# Patient Record
Sex: Male | Born: 1970 | Hispanic: No | Marital: Married | Smoking: Never smoker
Health system: Southern US, Community
[De-identification: ages and names within clinical notes are randomized; demographics above are authoritative.]

## PROBLEM LIST (undated history)

## (undated) DIAGNOSIS — K529 Noninfective gastroenteritis and colitis, unspecified: Secondary | ICD-10-CM

## (undated) DIAGNOSIS — Z9289 Personal history of other medical treatment: Secondary | ICD-10-CM

## (undated) DIAGNOSIS — R0683 Snoring: Secondary | ICD-10-CM

## (undated) DIAGNOSIS — J329 Chronic sinusitis, unspecified: Secondary | ICD-10-CM

## (undated) DIAGNOSIS — E782 Mixed hyperlipidemia: Secondary | ICD-10-CM

## (undated) DIAGNOSIS — R000 Tachycardia, unspecified: Secondary | ICD-10-CM

## (undated) DIAGNOSIS — R51 Headache: Secondary | ICD-10-CM

## (undated) DIAGNOSIS — R7989 Other specified abnormal findings of blood chemistry: Secondary | ICD-10-CM

## (undated) DIAGNOSIS — I10 Essential (primary) hypertension: Secondary | ICD-10-CM

## (undated) DIAGNOSIS — E119 Type 2 diabetes mellitus without complications: Secondary | ICD-10-CM

## (undated) DIAGNOSIS — Z0000 Encounter for general adult medical examination without abnormal findings: Secondary | ICD-10-CM

## (undated) HISTORY — DX: Essential (primary) hypertension: I10

## (undated) HISTORY — DX: Encounter for general adult medical examination without abnormal findings: Z00.00

## (undated) HISTORY — PX: OTHER SURGICAL HISTORY: SHX169

## (undated) HISTORY — DX: Headache: R51

## (undated) HISTORY — DX: Other specified abnormal findings of blood chemistry: R79.89

## (undated) HISTORY — DX: Personal history of other medical treatment: Z92.89

## (undated) HISTORY — DX: Mixed hyperlipidemia: E78.2

## (undated) HISTORY — DX: Type 2 diabetes mellitus without complications: E11.9

## (undated) HISTORY — DX: Noninfective gastroenteritis and colitis, unspecified: K52.9

## (undated) HISTORY — DX: Chronic sinusitis, unspecified: J32.9

## (undated) HISTORY — DX: Tachycardia, unspecified: R00.0

## (undated) HISTORY — DX: Snoring: R06.83

---

## 1999-10-11 HISTORY — PX: OTHER SURGICAL HISTORY: SHX169

## 2007-10-11 DIAGNOSIS — Z9289 Personal history of other medical treatment: Secondary | ICD-10-CM

## 2007-10-11 HISTORY — DX: Personal history of other medical treatment: Z92.89

## 2008-08-06 ENCOUNTER — Encounter: Payer: Self-pay | Admitting: Internal Medicine

## 2008-08-11 ENCOUNTER — Encounter: Payer: Self-pay | Admitting: Internal Medicine

## 2009-07-11 ENCOUNTER — Emergency Department (HOSPITAL_BASED_OUTPATIENT_CLINIC_OR_DEPARTMENT_OTHER): Admission: EM | Admit: 2009-07-11 | Discharge: 2009-07-12 | Payer: Self-pay | Admitting: Emergency Medicine

## 2009-07-11 ENCOUNTER — Ambulatory Visit: Payer: Self-pay | Admitting: Diagnostic Radiology

## 2010-08-12 ENCOUNTER — Ambulatory Visit: Payer: Self-pay | Admitting: Internal Medicine

## 2010-08-12 DIAGNOSIS — I10 Essential (primary) hypertension: Secondary | ICD-10-CM | POA: Insufficient documentation

## 2010-08-16 ENCOUNTER — Encounter: Payer: Self-pay | Admitting: Internal Medicine

## 2010-08-16 LAB — CONVERTED CEMR LAB: Creatinine Clearance: 183 mL/min — ABNORMAL HIGH (ref 75–125)

## 2010-08-24 LAB — CONVERTED CEMR LAB
ALT: 17 units/L (ref 0–53)
AST: 18 units/L (ref 0–37)
Albumin: 4.5 g/dL (ref 3.5–5.2)
Alkaline Phosphatase: 41 units/L (ref 39–117)
BUN: 16 mg/dL (ref 6–23)
Bilirubin, Direct: 0.1 mg/dL (ref 0.0–0.3)
C-Peptide: 0.95 ng/mL (ref 0.80–3.90)
CO2: 27 meq/L (ref 19–32)
Creatinine, Ser: 0.95 mg/dL (ref 0.40–1.50)
Creatinine, Urine: 266.5 mg/dL
Ferritin: 209 ng/mL (ref 22–322)
HDL: 49 mg/dL (ref >39)
Indirect Bilirubin: 0.5 mg/dL (ref 0.0–0.9)
LDL Cholesterol: 158 mg/dL — ABNORMAL HIGH (ref 0–99)
Potassium: 4.6 meq/L (ref 3.5–5.3)
Sodium: 138 meq/L (ref 135–145)
Total Bilirubin: 0.6 mg/dL (ref 0.3–1.2)
Total Protein: 6.9 g/dL (ref 6.0–8.3)
Triglycerides: 79 mg/dL (ref <150)

## 2010-08-25 ENCOUNTER — Encounter: Payer: Self-pay | Admitting: Internal Medicine

## 2010-09-10 ENCOUNTER — Encounter: Payer: Self-pay | Admitting: Internal Medicine

## 2010-09-13 ENCOUNTER — Ambulatory Visit: Payer: Self-pay

## 2010-09-13 ENCOUNTER — Encounter: Payer: Self-pay | Admitting: Internal Medicine

## 2010-09-14 ENCOUNTER — Encounter: Payer: Self-pay | Admitting: Internal Medicine

## 2010-09-27 ENCOUNTER — Encounter: Payer: Self-pay | Admitting: Internal Medicine

## 2010-09-27 DIAGNOSIS — E782 Mixed hyperlipidemia: Secondary | ICD-10-CM | POA: Insufficient documentation

## 2010-09-27 DIAGNOSIS — M5412 Radiculopathy, cervical region: Secondary | ICD-10-CM | POA: Insufficient documentation

## 2010-09-27 DIAGNOSIS — E119 Type 2 diabetes mellitus without complications: Secondary | ICD-10-CM | POA: Insufficient documentation

## 2010-09-27 DIAGNOSIS — R0789 Other chest pain: Secondary | ICD-10-CM | POA: Insufficient documentation

## 2010-09-27 HISTORY — DX: Mixed hyperlipidemia: E78.2

## 2010-09-29 ENCOUNTER — Ambulatory Visit: Payer: Self-pay | Admitting: Internal Medicine

## 2010-11-09 NOTE — Miscellaneous (Signed)
Summary: Eye Exam  Clinical Lists Changes  Observations: Added new observation of DMEYEEXAMNXT: 09/2011 (09/14/2010 12:22) Added new observation of DMEYEEXMRES: normal (09/13/2010 12:24) Added new observation of EYE EXAM BY: [facility]  (09/13/2010 12:24) Added new observation of DIAB EYE EX: normal (09/13/2010 12:24)         Diabetes Management Exam:    Eye Exam:       Eye Exam done elsewhere          Date: 09/13/2010          Results: normal          Done by: [facility]

## 2010-11-09 NOTE — Letter (Signed)
   [place] at [facility] [address] [place], [place]  [address]  [place] Phone: [phone]      August 25, 2010   Darren Salazar [address] [place], [place] [address]  RE:  LAB RESULTS  Dear  Mr. HUGULEY,  The following is an interpretation of your most recent lab tests.  Please take note of any instructions provided or changes to medications that have resulted from your lab work.  ELECTROLYTES:  Good - no changes needed  KIDNEY FUNCTION TESTS:  Good - no changes needed  LIVER FUNCTION TESTS:  Good - no changes needed  LIPID PANEL:  Abnormal - schedule a follow-up appointment Triglyceride: 79   Cholesterol: 223   LDL: 158   HDL: 49   Chol/HDL%:  4.6 Ratio  DIABETIC STUDIES:  Good - no changes needed Blood Glucose: 108   HgbA1C: 6.0   Microalbumin/Creatinine Ratio: 2.3     Please follow low saturated fat diet (see enclosed handout).       Sincerely Yours,    Dr. Thomos Lemons  Appended Document:  mailed

## 2010-11-09 NOTE — Miscellaneous (Signed)
Summary: Orders Update  Clinical Lists Changes  Orders: Added new Test order of Renal Artery Duplex (Renal Artery Duplex) - Signed 

## 2010-11-09 NOTE — Assessment & Plan Note (Signed)
Summary: new to be est bcbs/mhf   Vital Signs:  Patient profile:   40 year old male Height:      67.5 inches Weight:      181.50 pounds BMI:     28.11 O2 Sat:      99 % on Room air Temp:     98.2 degrees F oral Pulse rate:   83 / minute Pulse rhythm:   irregular BP sitting:   126 / 70  (left arm) Cuff size:   large  Vitals Entered By: Glendell Docker CMA (August 12, 2010 9:32 AM)  O2 Flow:  Room air CC: New patient  Is Patient Diabetic? Yes Did you bring your meter with you today? No Pain Assessment Patient in pain? no      Comments establish care, left knee ? fluid build up, joint is stiff, notices after activity   Primary Care Provider:  Dondra Spry DO  CC:  New patient .  History of Present Illness: 40 y/o  male to establish diagnosed DM II this summer -  doesn't know A1c 90-120 (fasting) some wt flucations no severe obesity  father has diabetes type II  htn - since 2006 hx of severe elevations (hospitalized) no work up for secondary htn  Preventive Screening-Counseling & Management  Alcohol-Tobacco     Alcohol drinks/day: 0     Smoking Status: never  Caffeine-Diet-Exercise     Caffeine use/day: 1 every other day     Does Patient Exercise: yes     Times/week: 4      Drug Use:  no.    Allergies (verified): No Known Drug Allergies  Past History:  Past Medical History: Diabetes mellitus, type II Hypertension  Past Surgical History: Cyst removal behind left ear-2001  Family History: Family History Diabetes 1st degree relative - father Family History Hypertension - mom and dad No CAD No CVA sister - Conservator, museum/gallery in [place] ( some complication from pregnancy ) ? 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    Physical Exam  General:  alert, well-developed, and well-nourished.   Head:  normocephalic and atraumatic.   Eyes:  pupils equal, pupils round, and pupils reactive to light.   Ears:  R ear normal and L ear normal.   Mouth:  pharynx pink and moist.   Neck:  No deformities, masses, or tenderness noted.no carotid bruits.   Lungs:  normal respiratory effort, normal breath sounds, no crackles, and no wheezes.   Heart:  normal rate, regular rhythm, no murmur, and no gallop.   Abdomen:  soft, non-tender, normal bowel sounds, no masses, no hepatomegaly, and no splenomegaly.  no abd bruit Extremities:  No lower extremity edema   Diabetes Management Exam:    Foot Exam (with socks and/or shoes not present):       Inspection:          Left foot: normal          Right foot: normal   Impression & Recommendations:  Problem # 1:  HYPERTENSION (ICD-401.9) Hx of severe Htn.  rule out secondary htn His  updated medication list for this problem includes:    Lisinopril-hydrochlorothiazide 20-12.5 Mg Tabs (Lisinopril-hydrochlorothiazide) .Marland Kitchen... Take 1 tablet by mouth once a day  Orders: T-Urine 24hr. Creatinine Clearance [id number]) T- * Misc. Laboratory test [phone]) T-Basic Metabolic Panel [phone]) Doppler Referral (Doppler)  BP today: 126/70  Problem # 2:  DIABETES MELLITUS, TYPE II (ICD-250.00) newly diagnosed diabetic (summer 2011)  His updated medication list for this problem includes:    Lisinopril-hydrochlorothiazide 20-12.5 Mg Tabs (Lisinopril-hydrochlorothiazide) .Marland Kitchen... Take 1 tablet by mouth once a day    Glucophage Xr 500 Mg Xr24h-tab (Metformin hcl) .Marland Kitchen... Take 1 tablet by mouth once a day with meal  Orders: T-Hepatic Function [id number]) T-Lipid Profile [phone]) T- Hemoglobin  A1C (27[address]-66440) T-Urine Microalbumin w/creat. ratio [id number]) T-Iron [id number]) T-Iron Binding Capacity (TIBC) ([address]) T-Ferritin [phone]) T- * Misc. Laboratory test [phone]) Ophthalmology Referral (Ophthalmology)  Complete Medication List: 1)  Lisinopril-hydrochlorothiazide 20-12.5 Mg Tabs (Lisinopril-hydrochlorothiazide) .... Take 1 tablet by mouth once a day 2)  Glucophage Xr 500 Mg Xr24h-tab (Metformin hcl) .... Take 1 tablet by mouth once a day with meal 3)  Multivitamins Tabs (Multiple vitamin) .... Take 1 tablet by mouth once a day  Other Orders: Flu Vaccine 107yrs + ([address]270) Admin 1st Vaccine ([address]76)  Patient Instructions: 1)  Please schedule a follow-up appointment in 2 months. Prescriptions: GLUCOPHAGE XR 500 MG XR24H-TAB (METFORMIN HCL) Take 1 tablet by mouth once a day with meal  #30 x 2   Entered and Authorized by:   D. Thomos Lemons DO   Signed by:   D. Thomos Lemons DO on 08/12/2010   Method used:   Print then Give to Patient   RxID:   [id number] LISINOPRIL-HYDROCHLOROTHIAZIDE 20-12.5 MG TABS (LISINOPRIL-HYDROCHLOROTHIAZIDE) Take 1 tablet by mouth once a day  #30 x 2   Entered and Authorized by:   D. Thomos Lemons DO   Signed by:   D. Thomos Lemons DO on 08/12/2010   Method used:   Print then Give to Patient   RxID:   [id number]    Orders Added: 1)  T-Urine 24hr. Creatinine Clearance [[id number]] 2)  T- * Misc. Laboratory test [99999] 3)  T-Basic Metabolic Panel [80048-22910] 4)  T-Hepatic Function [80076-22960] 5)  T-Lipid Profile [80061-22930] 6)  T- Hemoglobin A1C [83036-23375] 7)  T-Urine Microalbumin w/creat. ratio [[address]] 8)  T-Iron [93818-29937] 9)  T-Iron Binding Capacity (TIBC) [[address]] 10)  T-Ferritin [82728-23350] 11)  T- * Misc. Laboratory test [99999] 12)  Doppler Referral [Doppler] 13)  Flu Vaccine 22yrs + [90658] 14)  Admin 1st Vaccine [90471] 15)  Ophthalmology Referral [Ophthalmology] 16)  New Patient  Level III [99203]   Immunization History:  Tetanus/Td Immunization History:    Tetanus/Td:  historical (07/20/2004)  Immunizations Administered:  Influenza Vaccine # 1:    Vaccine Type: Fluvax 3+    Site: right deltoid    Mfr: GlaxoSmithKline    Dose: 0.5 ml    Route: IM    Given by: Glendell Docker CMA    Exp. Date: 04/09/2011    Lot #: [id number]    VIS given: 05/04/10 version given August 12, 2010.  Flu Vaccine Consent Questions:    Do you have a history of severe allergic reactions to this vaccine? no    Any prior history of allergic reactions to egg and/or gelatin? no    Do you have a sensitivity to the preservative Thimersol? no    Do you have a past history of Guillan-Barre Syndrome? no    Do you  currently have an acute febrile illness? no    Have you ever had a severe reaction to latex? no    Vaccine information given and explained to patient? yes   Immunization History:  Tetanus/Td Immunization History:    Tetanus/Td:  Historical (07/20/2004)  Immunizations Administered:  Influenza Vaccine # 1:    Vaccine Type: Fluvax 3+    Site: right deltoid    Mfr: GlaxoSmithKline    Dose: 0.5 ml    Route: IM    Given by: Glendell Docker CMA    Exp. Date: 04/09/2011    Lot #: [id number]    VIS given: 05/04/10 version given August 12, 2010.  Current Allergies (reviewed today): No known allergies

## 2010-11-11 NOTE — Letter (Signed)
Summary: [facility]   Imported By: Sherian Rein 09/20/2010 14:22:37  _____________________________________________________________________  External Attachment:    Type:   Image     Comment:   External Document

## 2010-11-11 NOTE — Assessment & Plan Note (Signed)
Summary: 2 MONTH FOLLOW UP/MHF   Vital Signs:  Patient profile:   40 year old male Height:      67.5 inches Weight:      186 pounds BMI:     28.81 O2 Sat:      100 % on Room air Temp:     98.3 degrees F oral Pulse rate:   69 / minute Resp:     18 per minute BP sitting:   130 / 78  (right arm) Cuff size:   large  Vitals Entered By: Glendell Docker CMA (September 27, 2010 8:36 AM)  O2 Flow:  Room air CC: 2 Month Follow up Is Patient Diabetic? Yes Pain Assessment Patient in pain? yes     Location: neck Intensity: 8 Type: tingling Onset of pain  Intermittent Comments c/o tenderness in right pinky, neck pain off and on for the past 2 weeks, low blood sugar 99 high 120 avg 100-105   Primary Care Provider:  Dondra Spry DO  CC:  2 Month Follow up.  History of Present Illness: 2 days ago - woke up with neck pain that radiates to chest  in the past pt has intermittent pain along left arm pain can also radiate across chest when he works out - his symptoms get better symptoms seem to worse with stress  left side of the neck symptomatic   Preventive Screening-Counseling & Management  Alcohol-Tobacco     Smoking Status: never  Allergies (verified): No Known Drug Allergies  Past History:  Past Medical History: Diabetes mellitus, type II Hypertension Hx of normal stress test - 2009  Family History: Family History Diabetes 1st degree relative - father Family History Hypertension - mom and dad No CAD No CVA sister - Conservator, museum/gallery in [place] ( some complication from pregnancy ) ? 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   Head:  normocephalic and atraumatic.   Eyes:  pupils equal, pupils round, and pupils reactive to light.   Chest Wall:  no chest wall  tenderness Lungs:  normal respiratory effort and normal breath sounds.   Heart:  normal rate, regular rhythm, no gallop, and no rub.   Extremities:  No lower extremity edema  Neurologic:  left forearm - hyperreflexia patellar reflex +3 bilaterally Psych:  normally interactive, good eye contact, not anxious appearing, and not depressed appearing.     Impression & Recommendations:  Problem # 1:  HYPERTENSION (ICD-401.9)  renal artery normal slight elevation in urinary metanephrines refer to endo for further eval  His updated medication list for this problem includes:    Lisinopril-hydrochlorothiazide 20-12.5 Mg Tabs (Lisinopril-hydrochlorothiazide) .Marland Kitchen... Take 1 tablet by mouth once a day  BP today: 130/78 Prior BP: 126/70 (08/12/2010)  Labs Reviewed: K+: 4.6 (08/24/2010) Creat: : 0.95 (08/24/2010)   Chol: 223 (08/24/2010)   HDL: 49 (08/24/2010)   LDL: 158 (08/24/2010)   TG: 79 (08/24/2010)  Orders: Endocrinology Referral (Endocrine)  Problem # 2:  HYPERLIPIDEMIA (ICD-272.4) Assessment: Deteriorated  His updated medication list for this problem includes:    Lipitor 20 Mg Tabs (Atorvastatin calcium) ..... 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 Take 1 tablet by mouth once a day with meal  Problem # 5:  CHEST PAIN, ATYPICAL (ICD-786.59)  symptoms not exertional  last stress test was 2009 with [facility]  cardiology unclear if symptoms radiating from previous neck injury  Orders: CXR- 2view (CXR) EKG w/ Interpretation (93000)  Complete Medication List: 1)  Lisinopril-hydrochlorothiazide 20-12.5 Mg Tabs (Lisinopril-hydrochlorothiazide) .... Take 1 tablet by mouth once a  day 2)  Glucophage Xr 500 Mg Xr24h-tab (Metformin hcl) .... Take 1 tablet by mouth once a day with meal 3)  Multivitamins Tabs (Multiple vitamin) .... Take 1 tablet by mouth once a day 4)  Lipitor 20 Mg Tabs (Atorvastatin calcium) .... One by mouth once daily 5)  Gabapentin 100 Mg Caps (Gabapentin) .... One by mouth at bedtime  Patient Instructions: 1)  Please schedule a follow-up appointment in 2 months. 2)  Hepatic Panel prior to visit, ICD-9: 272.4 3)  Lipid Panel prior to visit, ICD-9: 272.4 4)  Please return for lab work one (1) week before your next appointment.  Prescriptions: GLUCOPHAGE XR 500 MG XR24H-TAB (METFORMIN HCL) Take 1 tablet by mouth once a day with meal  #30 x 3   Entered and Authorized by:   D. Thomos Lemons DO   Signed by:   D. Thomos Lemons DO on 09/27/2010   Method used:   Electronically to        [facility] (retail)       [address]       [place], [place]  [address]       Ph: [phone]       Fax: [phone]   RxID:   [id number] LISINOPRIL-HYDROCHLOROTHIAZIDE 20-12.5 MG TABS (LISINOPRIL-HYDROCHLOROTHIAZIDE) Take 1 tablet by mouth once a day  #30 x 3   Entered and Authorized by:   D. Thomos Lemons DO   Signed by:   D. Thomos Lemons DO on 09/27/2010   Method used:   Electronically to        [facility] (retail)       [address]       [place], [place]  [address]       Ph: [phone]       Fax: [phone]   RxID:   [id number] GABAPENTIN 100 MG CAPS (GABAPENTIN) one by mouth at bedtime  #30 x 1   Entered and Authorized by:   D. Thomos Lemons DO   Signed by:   D. Thomos Lemons DO on 09/27/2010   Method used:   Electronically to        [facility] (retail)       [address]       [place], [place]  [address]       Ph: [phone]       Fax: [phone]   RxID:   [id number] LIPITOR 20 MG TABS (ATORVASTATIN CALCIUM) one by mouth  once daily  #30 x 3   Entered and Authorized by:   D. Thomos Lemons DO   Signed by:   D. Thomos Lemons DO on 09/27/2010   Method used:   Electronically to        [facility] (retail)  [address]       [place], [place]  [address]       Ph: [phone]       Fax: [phone]   RxID:   [id number]    Orders Added: 1)  CXR- 2view [CXR] 2)  EKG w/ Interpretation [93000] 3)  Endocrinology Referral [Endocrine] 4)  Est. Patient Level IV [401[address]]    Current Allergies (reviewed today): No known allergies

## 2010-11-22 ENCOUNTER — Encounter: Payer: Self-pay | Admitting: Internal Medicine

## 2010-11-23 ENCOUNTER — Encounter: Payer: Self-pay | Admitting: Internal Medicine

## 2010-12-01 ENCOUNTER — Ambulatory Visit (INDEPENDENT_AMBULATORY_CARE_PROVIDER_SITE_OTHER): Payer: Federal, State, Local not specified - PPO | Admitting: Internal Medicine

## 2010-12-01 ENCOUNTER — Encounter: Payer: Self-pay | Admitting: Internal Medicine

## 2010-12-01 DIAGNOSIS — R7309 Other abnormal glucose: Secondary | ICD-10-CM

## 2010-12-01 DIAGNOSIS — I10 Essential (primary) hypertension: Secondary | ICD-10-CM

## 2010-12-01 NOTE — Miscellaneous (Signed)
Summary: Orders Update  Clinical Lists Changes  Orders: Added new Test order of T-Hepatitis Profile Acute [phone]) - Signed Added new Test order of T-Lipid Profile [phone]) - Signed

## 2010-12-16 NOTE — Consult Note (Signed)
Summary: [facility]   Imported By: Maryln Gottron 12/08/2010 15:08:28  _____________________________________________________________________  External Attachment:    Type:   Image     Comment:   External Document

## 2010-12-21 NOTE — Assessment & Plan Note (Signed)
Summary: 2 month follow up/mhf   Vital Signs:  Patient profile:   40 year old male Height:      67.5 inches Weight:      187.25 pounds BMI:     29.00 O2 Sat:      98 % on Room air Temp:     98.6 degrees F oral Pulse rate:   89 / minute Resp:     18 per minute BP sitting:   110 / 70  (right arm) Cuff size:   large  Vitals Entered By: Glendell Docker CMA (December 01, 2010 8:22 AM)  O2 Flow:  Room air  Primary Care Provider:  D. Thomos Lemons DO   History of Present Illness:  40 year old male for followup  Since previous visit patient seen by endocrinologist   pheochromocytoma felt to be unlikely  however  workup for Cushing's syndrome and  hyperaldosteronism initiated   hypertension-stable  DM II - stays active.  tolerating metformin  Preventive Screening-Counseling & Management  Alcohol-Tobacco     Smoking Status: never  Allergies (verified): No Known Drug Allergies  Past History:  Past Medical History: Diabetes mellitus, type II Hypertension Hx of normal stress test - 2009   Past Surgical History: Cyst removal behind left ear-2001   Family History: Family History Diabetes 1st degree relative - father Family History Hypertension - mom and dad No CAD No CVA sister - Conservator, museum/gallery in [place] ( some complication from pregnancy ) ? 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   Neck:  No deformities, masses, or tenderness noted.no carotid bruits.   Lungs:  normal respiratory effort and normal breath sounds.   Heart:  normal rate, regular rhythm, no gallop, and no rub.     Impression & Recommendations:  Problem # 1:  HYPERTENSION (ICD-401.9) Assessment Improved  patient evaluated by endocrinologist   pheochromocytoma thought to be less likely    workup for hyperaldosteronism and  Cushing syndrome pending  His updated medication list for this problem includes:    Lisinopril-hydrochlorothiazide 20-12.5 Mg Tabs (Lisinopril-hydrochlorothiazide) .Marland Kitchen... Take 1 tablet by mouth once a day  BP today: 110/70 Prior BP: 130/78 (09/27/2010)  Labs Reviewed: K+: 4.6 (08/24/2010) Creat: : 0.95 (08/24/2010)   Chol: 223 (08/24/2010)   HDL: 49 (08/24/2010)   LDL: 158 (08/24/2010)   TG: 79 (08/24/2010)  Problem # 2:  DIABETES MELLITUS, TYPE II, BORDERLINE (ICD-790.29)  His updated medication list for this problem includes:    Glucophage Xr 500 Mg Xr24h-tab (Metformin hcl) .Marland Kitchen... Take 1 tablet by mouth two times a day  Labs Reviewed: Creat: 0.95 (08/24/2010)     Last Eye Exam: normal (09/13/2010)  Complete Medication List: 1)  Lisinopril-hydrochlorothiazide 20-12.5 Mg Tabs (Lisinopril-hydrochlorothiazide) .... Take 1 tablet by mouth once a day 2)  Glucophage Xr 500 Mg Xr24h-tab (Metformin hcl) .... Take 1 tablet by mouth two times a day 3)  Multivitamins Tabs (Multiple vitamin) .... Take 1 tablet by mouth once a day 4)  Lipitor 20 Mg Tabs (Atorvastatin calcium) .... One by mouth once daily  Patient Instructions: 1)  Please schedule a follow-up appointment in 6 months. 2)  BMP prior to visit, ICD-9:  401.9 3)  HbgA1C prior to visit, ICD-9: 790.29 4)  Hepatic Panel prior to visit, ICD-9:  272.4 5)  Lipid Panel prior to visit, ICD-9: 272.4 6)  Please return for lab work in May, 2012 Prescriptions: GLUCOPHAGE XR 500 MG XR24H-TAB (METFORMIN HCL) Take 1 tablet by mouth two times a day  #180 x 1   Entered and Authorized by:   D. Thomos Lemons DO   Signed by:   D. Thomos Lemons DO on 12/01/2010   Method used:   Electronically to        [facility] (retail)       [address]       [place], [place]  [address]       Ph: [phone]       Fax: [phone]   RxID:   [id number] LIPITOR 20 MG TABS  (ATORVASTATIN CALCIUM) one by mouth once daily  #90 x 1   Entered and Authorized by:   D. Thomos Lemons DO   Signed by:   D. Thomos Lemons DO on 12/01/2010   Method used:   Electronically to        [facility] (retail)       [address]       [place], [place]  [address]       Ph: [phone]       Fax: [phone]   RxID:   [id number] LISINOPRIL-HYDROCHLOROTHIAZIDE 20-12.5 MG TABS (LISINOPRIL-HYDROCHLOROTHIAZIDE) Take 1 tablet by mouth once a day  #90 x 1   Entered and Authorized by:   D. Thomos Lemons DO   Signed by:   D. Thomos Lemons DO on 12/01/2010   Method used:   Electronically to        [facility] (retail)       [address]       [place], [place]  [address]       Ph: [phone]       Fax: [phone]   RxID:   [id number]    Orders Added: 1)  Est. Patient Level III [[address]]    Current Allergies (reviewed today): No known allergies

## 2011-01-13 LAB — PROTIME-INR: INR: 0.9 (ref 0.00–1.49)

## 2011-01-13 LAB — CBC
Hemoglobin: 14.7 g/dL (ref 13.0–17.0)
MCHC: 34.6 g/dL (ref 30.0–36.0)
Platelets: 175 10*3/uL (ref 150–400)
RBC: 4.71 MIL/uL (ref 4.22–5.81)
WBC: 4.4 10*3/uL (ref 4.0–10.5)

## 2011-01-13 LAB — POCT CARDIAC MARKERS
CKMB, poc: <1 ng/mL — ABNORMAL LOW (ref 1.0–8.0)
Myoglobin, poc: 24.7 ng/mL (ref 12–200)
Myoglobin, poc: 32.1 ng/mL (ref 12–200)
Troponin i, poc: <0.05 ng/mL (ref 0.00–0.09)

## 2011-01-13 LAB — DIFFERENTIAL
Basophils Absolute: 0 10*3/uL (ref 0.0–0.1)
Eosinophils Relative: 3 % (ref 0–5)
Lymphs Abs: 2 10*3/uL (ref 0.7–4.0)
Monocytes Absolute: 0.5 10*3/uL (ref 0.1–1.0)

## 2011-01-13 LAB — BASIC METABOLIC PANEL
BUN: 22 mg/dL (ref 6–23)
Calcium: 9.6 mg/dL (ref 8.4–10.5)
Creatinine, Ser: 1 mg/dL (ref 0.4–1.5)
Glucose, Bld: 138 mg/dL — ABNORMAL HIGH (ref 70–99)

## 2011-02-21 ENCOUNTER — Other Ambulatory Visit: Payer: Self-pay | Admitting: *Deleted

## 2011-02-21 ENCOUNTER — Other Ambulatory Visit: Payer: Self-pay | Admitting: Internal Medicine

## 2011-02-21 DIAGNOSIS — I10 Essential (primary) hypertension: Secondary | ICD-10-CM

## 2011-02-21 DIAGNOSIS — E785 Hyperlipidemia, unspecified: Secondary | ICD-10-CM

## 2011-02-21 DIAGNOSIS — R7309 Other abnormal glucose: Secondary | ICD-10-CM

## 2011-02-21 LAB — BASIC METABOLIC PANEL
CO2: 25 mEq/L (ref 19–32)
Calcium: 9.9 mg/dL (ref 8.4–10.5)
Glucose, Bld: 113 mg/dL — ABNORMAL HIGH (ref 70–99)
Sodium: 134 mEq/L — ABNORMAL LOW (ref 135–145)

## 2011-02-21 LAB — HEPATIC FUNCTION PANEL
ALT: 16 U/L (ref 0–53)
AST: 17 U/L (ref 0–37)
Albumin: 4.7 g/dL (ref 3.5–5.2)
Alkaline Phosphatase: 43 U/L (ref 39–117)
Total Protein: 7.5 g/dL (ref 6.0–8.3)

## 2011-02-21 LAB — LIPID PANEL
Cholesterol: 158 mg/dL (ref 0–200)
LDL Cholesterol: 94 mg/dL (ref 0–99)
Total CHOL/HDL Ratio: 2.8 Ratio
Triglycerides: 38 mg/dL (ref <150)

## 2011-02-22 LAB — HEMOGLOBIN A1C
Hgb A1c MFr Bld: 6 % — ABNORMAL HIGH (ref <5.7)
Mean Plasma Glucose: 126 mg/dL — ABNORMAL HIGH (ref <117)

## 2011-03-27 ENCOUNTER — Encounter: Payer: Self-pay | Admitting: Family

## 2011-03-30 ENCOUNTER — Encounter: Payer: Self-pay | Admitting: Internal Medicine

## 2011-03-31 ENCOUNTER — Encounter: Payer: Self-pay | Admitting: Internal Medicine

## 2011-03-31 ENCOUNTER — Ambulatory Visit (INDEPENDENT_AMBULATORY_CARE_PROVIDER_SITE_OTHER): Payer: Federal, State, Local not specified - PPO | Admitting: Internal Medicine

## 2011-03-31 VITALS — BP 128/80 | Temp 97.9°F | Ht 67.5 in | Wt 181.0 lb

## 2011-03-31 DIAGNOSIS — M5412 Radiculopathy, cervical region: Secondary | ICD-10-CM

## 2011-03-31 MED ORDER — METHYLPREDNISOLONE (PAK) 4 MG PO TABS: ORAL_TABLET | ORAL | Status: AC

## 2011-04-01 NOTE — Progress Notes (Signed)
  Subjective:    Patient ID: Darren Salazar, male    DOB: 16-Jan-1971, 40 y.o.   MRN: [id number]  HPI Pt presents to clinic for evaluation of neck pain. Notes chronic intermittent neck pain with left arm radiation and associated paresthesias. Denies focal weakness, injury or trauma. Exacerbated by cycling or sleeping in certain position. No alleviating factors. 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 Attempted low dose neurontin previously without improvement and stopped the medication. H/o mild DM well controlled without hyperglycemia. No other complaints.  Reviewed pmh, medications and allergies    Review of Systems  Musculoskeletal: Positive for arthralgias. Negative for myalgias and back pain.  Skin: Negative for color change and rash.  Neurological: Positive for numbness. Negative for tremors, weakness and headaches.       Objective:   Physical Exam  Nursing note and vitals reviewed. Constitutional: He appears well-developed and well-nourished. No distress.  HENT:  Head: Normocephalic and atraumatic.  Right Ear: External ear normal.  Left Ear: External ear normal.  Nose: Nose normal.  Eyes: Conjunctivae are normal. No scleral icterus.  Musculoskeletal:       FROM cervical spine. No bony abn.  Neurological: He is alert.       Left extremity mcp, intertriginous and wrist strength 5/5. LUE FROM.   Skin: Skin is warm and dry. No rash noted. He is not diaphoretic. No erythema.  Psychiatric: He has a normal mood and affect.          Assessment & Plan:

## 2011-04-01 NOTE — Assessment & Plan Note (Signed)
Attempt medrol dosepak with blood sugar monitoring. PT referral. Schedule follow up in 4 wks or sooner if necessary.

## 2011-04-26 ENCOUNTER — Ambulatory Visit: Payer: Federal, State, Local not specified - PPO | Admitting: Physical Therapy

## 2011-04-26 ENCOUNTER — Ambulatory Visit: Payer: Federal, State, Local not specified - PPO | Admitting: Rehabilitation

## 2011-04-28 ENCOUNTER — Encounter: Payer: Self-pay | Admitting: Internal Medicine

## 2011-04-28 ENCOUNTER — Ambulatory Visit: Payer: Federal, State, Local not specified - PPO | Admitting: Internal Medicine

## 2011-04-28 ENCOUNTER — Ambulatory Visit (INDEPENDENT_AMBULATORY_CARE_PROVIDER_SITE_OTHER): Payer: Federal, State, Local not specified - PPO | Admitting: Internal Medicine

## 2011-04-28 VITALS — BP 122/82 | HR 87 | Temp 98.7°F | Resp 16 | Ht 67.5 in | Wt 183.1 lb

## 2011-04-28 DIAGNOSIS — H538 Other visual disturbances: Secondary | ICD-10-CM | POA: Insufficient documentation

## 2011-04-28 DIAGNOSIS — M5412 Radiculopathy, cervical region: Secondary | ICD-10-CM

## 2011-04-28 NOTE — Assessment & Plan Note (Signed)
Clinically improved. Begin PT as scheduled.

## 2011-04-28 NOTE — Progress Notes (Signed)
  Subjective:    Patient ID: Darren Salazar, male    DOB: Jun 21, 1971, 40 y.o.   MRN: [id number]  HPI Pt presents to clinic for followup of cervical radiculopathy. Has had no further left arm radicular pain or paresthesias. Denies focal weakness. Neck pain overall better. Did complete medrol dosepak but has not yet begun PT. Notes one week h/o left eye blurriness without other visual disturbances. Denies neurologic deficits including numbness, tingling, weakness, difficulty with speech. No obvious trigger/injury/trauma and notes no alleviating or exacerbating factors. No other complaints.  Reviewed pmh, medications and allergies.    Review of Systems see hpi    Objective:   Physical Exam  Nursing note and vitals reviewed. Constitutional: He appears well-developed and well-nourished.  HENT:  Head: Normocephalic and atraumatic.  Right Ear: External ear normal.  Left Ear: External ear normal.  Nose: Nose normal.  Eyes: Conjunctivae and EOM are normal. Pupils are equal, round, and reactive to light. Right eye exhibits no discharge. Left eye exhibits no discharge. No scleral icterus.  Fundoscopic exam:      The right eye shows no hemorrhage and no papilledema.       The left eye shows no hemorrhage and no papilledema.  Skin: Skin is warm and dry. He is not diaphoretic.  Psychiatric: He has a normal mood and affect.          Assessment & Plan:

## 2011-04-28 NOTE — Assessment & Plan Note (Signed)
Opthamology consult

## 2011-05-13 ENCOUNTER — Ambulatory Visit: Payer: Federal, State, Local not specified - PPO | Admitting: Internal Medicine

## 2011-05-26 ENCOUNTER — Telehealth: Payer: Self-pay | Admitting: *Deleted

## 2011-05-26 MED ORDER — FLUTICASONE PROPIONATE 50 MCG/ACT NA SUSP: 2.0000 | Freq: Every day | NASAL | Status: DC

## 2011-05-26 MED ORDER — AMOXICILLIN-POT CLAVULANATE 875-125 MG PO TABS: 1.0000 | ORAL_TABLET | Freq: Two times a day (BID) | ORAL | Status: DC

## 2011-05-26 NOTE — Telephone Encounter (Signed)
Patient called and left voice message stating he was seen for blurred vision a few weeks ago and is scheduled to follow up with an eye appointment on 06/02/2011. His message states that he is still having a lot of sinus pressure in his left eye and pain in the front of his head. He stated that he has taken sinus medication and is not getting any relief. He would like to know if he needs evaluation.

## 2011-05-26 NOTE — Telephone Encounter (Signed)
Could tx for sinusitis while waiting for eye evaluation. augmentin 875mg  bid x 7d and flonase 2 sprays each nostril qhs

## 2011-05-26 NOTE — Telephone Encounter (Signed)
Call placed to patient at [phone], he was informed per Dr Rodena Medin instructions, and has verbalized understanding. He was advised if no improvement to call back.

## 2011-06-15 ENCOUNTER — Telehealth: Payer: Self-pay | Admitting: Internal Medicine

## 2011-06-15 MED ORDER — LISINOPRIL-HYDROCHLOROTHIAZIDE 20-12.5 MG PO TABS: 1.0000 | ORAL_TABLET | Freq: Every day | ORAL | Status: DC

## 2011-06-15 NOTE — Telephone Encounter (Signed)
Rx refill sent to pharmacy. 

## 2011-06-15 NOTE — Telephone Encounter (Signed)
Refill- lisinop/hctz 20-12. Tab gold. Take one tablet by mouth one time daily. Qty 90. Last fill 6.13.12

## 2011-06-24 ENCOUNTER — Encounter: Payer: Self-pay | Admitting: Internal Medicine

## 2011-06-24 ENCOUNTER — Ambulatory Visit (INDEPENDENT_AMBULATORY_CARE_PROVIDER_SITE_OTHER): Payer: Federal, State, Local not specified - PPO | Admitting: Internal Medicine

## 2011-06-24 ENCOUNTER — Telehealth: Payer: Self-pay | Admitting: Internal Medicine

## 2011-06-24 DIAGNOSIS — E119 Type 2 diabetes mellitus without complications: Secondary | ICD-10-CM

## 2011-06-24 DIAGNOSIS — R7309 Other abnormal glucose: Secondary | ICD-10-CM

## 2011-06-24 DIAGNOSIS — Z23 Encounter for immunization: Secondary | ICD-10-CM

## 2011-06-24 DIAGNOSIS — M5412 Radiculopathy, cervical region: Secondary | ICD-10-CM

## 2011-06-24 DIAGNOSIS — E785 Hyperlipidemia, unspecified: Secondary | ICD-10-CM

## 2011-06-24 LAB — HEMOGLOBIN A1C
Hgb A1c MFr Bld: 6.1 % — ABNORMAL HIGH (ref <5.7)
Mean Plasma Glucose: 128 mg/dL — ABNORMAL HIGH (ref <117)

## 2011-06-24 LAB — BASIC METABOLIC PANEL
CO2: 21 mEq/L (ref 19–32)
Calcium: 9.7 mg/dL (ref 8.4–10.5)
Chloride: 99 mEq/L (ref 96–112)
Glucose, Bld: 114 mg/dL — ABNORMAL HIGH (ref 70–99)
Potassium: 4.6 mEq/L (ref 3.5–5.3)

## 2011-06-24 NOTE — Patient Instructions (Signed)
Please schedule chem7, a1c (250.0) and lipid/lft (272.4) prior to next visit 

## 2011-06-24 NOTE — Progress Notes (Signed)
  Subjective:    Patient ID: Darren Salazar, male    DOB: 06-03-1971, 40 y.o.   MRN: [id number]  HPI Pt presents to clinic for followup of multiple medical problems. No flares of left cervical radiculopathy. Previous left eye blurriness evaluated by optho reportedly without sinister etiology. Believes may have been more allergy related. Does have chronic sinus/nasal congestion. Takes flonase with flares and improves sx's. No other complaints.  Past Medical History  Diagnosis Date  . Diabetes mellitus type II   . Hypertension   . History of cardiovascular stress test 2009    normal   Past Surgical History  Procedure Date  . Other surgical history 2001     cyst removal behind left ear    reports that he has never smoked. He does not have any smokeless tobacco history on file. He reports that he does not drink alcohol or use illicit drugs. family history includes Diabetes in his father and Hypertension in his father and mother.  There is no history of Other. No Known Allergies   Review of Systems see hpi     Objective:   Physical Exam  Physical Exam  Nursing note and vitals reviewed. Constitutional: Appears well-developed and well-nourished. No distress.  HENT:  Head: Normocephalic and atraumatic.  Right Ear: External ear normal.  Left Ear: External ear normal.  Eyes: Conjunctivae are normal. No scleral icterus.  Neck: Neck supple. Carotid bruit is not present.  Cardiovascular: Normal rate, regular rhythm and normal heart sounds.  Exam reveals no gallop and no friction rub.   No murmur heard. Pulmonary/Chest: Effort normal and breath sounds normal. No respiratory distress. He has no wheezes. no rales.  Lymphadenopathy:    He has no cervical adenopathy.  Neurological:Alert.  Skin: Skin is warm and dry. Not diaphoretic.  Psychiatric: Has a normal mood and affect.   MSK: left hand distal mcp/phalanges strength 5/5. No muscle wasting of hand/arm noted.      Assessment & Plan:

## 2011-06-24 NOTE — Assessment & Plan Note (Signed)
Obtain chem7, a1c, urine microalbumin. If remains under good control consider q6 month followup

## 2011-06-24 NOTE — Assessment & Plan Note (Signed)
Stable. Monitor for worsening of sx's. Aware of need to monitor for focal weakness

## 2011-06-24 NOTE — Assessment & Plan Note (Signed)
Obtain lipid/lft prior to next visit 

## 2011-06-24 NOTE — Telephone Encounter (Signed)
Patient needs labs one week prior to next appt on 10/25/11. Patient will go downstairs to [facility].  chem7 and A1c 250.0  Lipid/lft 272.4

## 2011-06-24 NOTE — Progress Notes (Signed)
Addended by: Glendell Docker on: 06/24/2011 08:47 AM   Modules accepted: Orders

## 2011-06-25 LAB — MICROALBUMIN / CREATININE URINE RATIO
Creatinine, Urine: 162.8 mg/dL
Microalb Creat Ratio: 3.1 mg/g (ref 0.0–30.0)
Microalb, Ur: 0.5 mg/dL (ref 0.00–1.89)

## 2011-08-08 ENCOUNTER — Telehealth: Payer: Self-pay | Admitting: Internal Medicine

## 2011-08-08 MED ORDER — GABAPENTIN 100 MG PO CAPS: 100.0000 mg | ORAL_CAPSULE | Freq: Every day | ORAL | Status: DC

## 2011-08-08 NOTE — Telephone Encounter (Signed)
Rx refill sent to pharmacy. 

## 2011-08-08 NOTE — Telephone Encounter (Signed)
Yes. Max # rf allowed

## 2011-08-08 NOTE — Telephone Encounter (Signed)
Refill gabapentin 100 mg cap qty 30 take 1 capsule by mouth nightly at bedtime last fill 11-22-2010

## 2011-08-08 NOTE — Telephone Encounter (Signed)
Is it okay to provide refill on Gabapentin for patient?

## 2011-08-09 ENCOUNTER — Telehealth: Payer: Self-pay | Admitting: Internal Medicine

## 2011-08-09 MED ORDER — METFORMIN HCL ER 500 MG PO TB24: 500.0000 mg | ORAL_TABLET | Freq: Two times a day (BID) | ORAL | Status: DC

## 2011-08-09 NOTE — Telephone Encounter (Signed)
NEEDS REFILL TO [facility]

## 2011-08-09 NOTE — Telephone Encounter (Signed)
Rx refill sent to pharmacy. 

## 2011-09-15 ENCOUNTER — Telehealth: Payer: Self-pay | Admitting: Internal Medicine

## 2011-09-15 MED ORDER — ATORVASTATIN CALCIUM 20 MG PO TABS: 20.0000 mg | ORAL_TABLET | Freq: Every day | ORAL | Status: DC

## 2011-09-15 NOTE — Telephone Encounter (Signed)
Rx refill sent to pharmacy. 

## 2011-09-15 NOTE — Telephone Encounter (Signed)
Refill- atorvastatin 20mg  tab wats. Take one tablet by mouth one time daily. Qty 90 last fill 10.13.12

## 2011-10-25 ENCOUNTER — Encounter: Payer: Self-pay | Admitting: Internal Medicine

## 2011-10-25 ENCOUNTER — Ambulatory Visit (INDEPENDENT_AMBULATORY_CARE_PROVIDER_SITE_OTHER): Payer: Federal, State, Local not specified - PPO | Admitting: Internal Medicine

## 2011-10-25 VITALS — BP 126/80 | HR 87 | Temp 98.2°F | Resp 18 | Wt 184.0 lb

## 2011-10-25 DIAGNOSIS — M5412 Radiculopathy, cervical region: Secondary | ICD-10-CM

## 2011-10-25 DIAGNOSIS — M542 Cervicalgia: Secondary | ICD-10-CM

## 2011-10-25 DIAGNOSIS — R7309 Other abnormal glucose: Secondary | ICD-10-CM

## 2011-10-25 DIAGNOSIS — E785 Hyperlipidemia, unspecified: Secondary | ICD-10-CM

## 2011-10-25 DIAGNOSIS — I10 Essential (primary) hypertension: Secondary | ICD-10-CM

## 2011-10-25 MED ORDER — LISINOPRIL-HYDROCHLOROTHIAZIDE 20-12.5 MG PO TABS: 1.0000 | ORAL_TABLET | Freq: Every day | ORAL | Status: DC

## 2011-10-25 MED ORDER — CYCLOBENZAPRINE HCL 5 MG PO TABS: 5.0000 mg | ORAL_TABLET | Freq: Three times a day (TID) | ORAL | Status: AC | PRN

## 2011-10-25 NOTE — Assessment & Plan Note (Signed)
Obtain lipid/lft. 

## 2011-10-25 NOTE — Progress Notes (Signed)
  Subjective:    Patient ID: Darren Salazar, male    DOB: 15-Dec-1970, 41 y.o.   MRN: [id number]  HPI Pt presents to clinic for followup of multiple medical problems. 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 Has intermittent left hand numbness without weakness and sx's tend to occur at night. Has no radicular arm pain. Now has intermittent posterior headaches without neurologic sx's. Taking no medication for the problem. Previously referred to PT but was unable to proceed at the time due to his schedule. Tolerating statin tx without myalgias. BP reviewed as normotensive.  Past Medical History  Diagnosis Date  . Diabetes mellitus type II   . Hypertension   . History of cardiovascular stress test 2009    normal   Past Surgical History  Procedure Date  . Other surgical history 2001     cyst removal behind left ear    reports that he has never smoked. He has never used smokeless tobacco. He reports that he does not drink alcohol or use illicit drugs. family history includes Diabetes in his father and Hypertension in his father and mother.  There is no history of Other. No Known Allergies    Review of Systems see hpi     Objective:   Physical Exam  Physical Exam  Nursing note and vitals reviewed. Constitutional: Appears well-developed and well-nourished. No distress.  HENT:  Head: Normocephalic and atraumatic.  Right Ear: External ear normal.  Left Ear: External ear normal.  Eyes: Conjunctivae are normal. No scleral icterus.  Neck: Neck supple. Carotid bruit is not present.  Cardiovascular: Normal rate, regular rhythm and normal heart sounds.  Exam reveals no gallop and no friction rub.   No murmur heard. Pulmonary/Chest: Effort normal and breath sounds normal. No respiratory distress. He has no wheezes. no rales.  Lymphadenopathy:    He has no cervical adenopathy.  Neurological:Alert. left hand/mcp/finger strength 5/5. Skin: Skin is warm and dry. Not diaphoretic.    Psychiatric: Has a normal mood and affect.       Assessment & Plan:

## 2011-10-25 NOTE — Assessment & Plan Note (Signed)
Obtain chem7 and a1c 

## 2011-10-25 NOTE — Assessment & Plan Note (Signed)
Attempt flexeril prn-cautioned regarding possible sedating effect. Schedule PT. Followup if no improvement or worsening.

## 2011-10-25 NOTE — Assessment & Plan Note (Signed)
Normotensive and stable. Continue current regimen. Monitor bp as outpt and followup in clinic as scheduled.  

## 2011-10-27 ENCOUNTER — Other Ambulatory Visit: Payer: Self-pay | Admitting: *Deleted

## 2011-10-27 DIAGNOSIS — E119 Type 2 diabetes mellitus without complications: Secondary | ICD-10-CM

## 2011-10-27 DIAGNOSIS — E785 Hyperlipidemia, unspecified: Secondary | ICD-10-CM

## 2011-10-27 LAB — HEPATIC FUNCTION PANEL
AST: 20 U/L (ref 0–37)
Albumin: 4.4 g/dL (ref 3.5–5.2)
Alkaline Phosphatase: 45 U/L (ref 39–117)
Indirect Bilirubin: 0.5 mg/dL (ref 0.0–0.9)
Total Protein: 7.1 g/dL (ref 6.0–8.3)

## 2011-10-27 LAB — BASIC METABOLIC PANEL
BUN: 16 mg/dL (ref 6–23)
CO2: 25 mEq/L (ref 19–32)
Chloride: 102 mEq/L (ref 96–112)
Creat: 0.88 mg/dL (ref 0.50–1.35)
Glucose, Bld: 110 mg/dL — ABNORMAL HIGH (ref 70–99)
Potassium: 4.5 mEq/L (ref 3.5–5.3)

## 2011-10-27 LAB — LIPID PANEL
HDL: 51 mg/dL (ref >39)
LDL Cholesterol: 75 mg/dL (ref 0–99)
Triglycerides: 38 mg/dL (ref <150)

## 2011-10-28 LAB — HEMOGLOBIN A1C
Hgb A1c MFr Bld: 6 % — ABNORMAL HIGH (ref <5.7)
Mean Plasma Glucose: 126 mg/dL — ABNORMAL HIGH (ref <117)

## 2012-02-24 ENCOUNTER — Encounter: Payer: Self-pay | Admitting: Internal Medicine

## 2012-02-24 ENCOUNTER — Ambulatory Visit (INDEPENDENT_AMBULATORY_CARE_PROVIDER_SITE_OTHER): Payer: Federal, State, Local not specified - PPO | Admitting: Internal Medicine

## 2012-02-24 ENCOUNTER — Telehealth: Payer: Self-pay | Admitting: Internal Medicine

## 2012-02-24 VITALS — BP 122/72 | HR 88 | Temp 98.2°F | Resp 18 | Ht 67.5 in | Wt 182.0 lb

## 2012-02-24 DIAGNOSIS — M5412 Radiculopathy, cervical region: Secondary | ICD-10-CM

## 2012-02-24 DIAGNOSIS — R7309 Other abnormal glucose: Secondary | ICD-10-CM

## 2012-02-24 DIAGNOSIS — I10 Essential (primary) hypertension: Secondary | ICD-10-CM

## 2012-02-24 DIAGNOSIS — E119 Type 2 diabetes mellitus without complications: Secondary | ICD-10-CM

## 2012-02-24 DIAGNOSIS — E785 Hyperlipidemia, unspecified: Secondary | ICD-10-CM

## 2012-02-24 LAB — LIPID PANEL
Cholesterol: 131 mg/dL (ref 0–200)
Total CHOL/HDL Ratio: 2.6 Ratio
VLDL: 8 mg/dL (ref 0–40)

## 2012-02-24 LAB — HEPATIC FUNCTION PANEL
Bilirubin, Direct: 0.2 mg/dL (ref 0.0–0.3)
Indirect Bilirubin: 0.6 mg/dL (ref 0.0–0.9)
Total Bilirubin: 0.8 mg/dL (ref 0.3–1.2)

## 2012-02-24 LAB — BASIC METABOLIC PANEL
BUN: 16 mg/dL (ref 6–23)
CO2: 26 mEq/L (ref 19–32)
Calcium: 9.2 mg/dL (ref 8.4–10.5)
Chloride: 101 mEq/L (ref 96–112)
Creat: 0.88 mg/dL (ref 0.50–1.35)

## 2012-02-24 LAB — HEMOGLOBIN A1C: Mean Plasma Glucose: 131 mg/dL — ABNORMAL HIGH (ref <117)

## 2012-02-24 NOTE — Assessment & Plan Note (Signed)
Normotensive and stable. Continue current regimen. Monitor bp as outpt and followup in clinic as scheduled.  

## 2012-02-24 NOTE — Assessment & Plan Note (Signed)
Obtain chem7 and a1c 
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
## 2012-02-24 NOTE — Assessment & Plan Note (Signed)
Mild subjective weakness by history and mild reproducible weakness on exam. Known h/o cervical disc herniation. Failing conservative care. Proceed with cervical MRI.

## 2012-02-24 NOTE — Patient Instructions (Signed)
We are in the process of scheduling your neck MRI Please scheduling labs prior to your next visit Chem7, a1c-250.0

## 2012-02-24 NOTE — Assessment & Plan Note (Signed)
Obtain lipid/lft. 

## 2012-02-24 NOTE — Telephone Encounter (Signed)
Please scheduling labs prior to your next visit  Chem7, a1c-250.0   Patient has upcoming appointment on 05/30/12. He will be going to [facility] lab.

## 2012-02-24 NOTE — Telephone Encounter (Signed)
Lab order entered for August 2013. 

## 2012-02-28 ENCOUNTER — Ambulatory Visit (HOSPITAL_BASED_OUTPATIENT_CLINIC_OR_DEPARTMENT_OTHER)
Admission: RE | Admit: 2012-02-28 | Discharge: 2012-02-28 | Disposition: A | Discharge status: Home / Self Care | Payer: Federal, State, Local not specified - PPO | Source: Ambulatory Visit | Attending: Internal Medicine | Admitting: Internal Medicine

## 2012-02-28 DIAGNOSIS — M79609 Pain in unspecified limb: Secondary | ICD-10-CM | POA: Insufficient documentation

## 2012-02-28 DIAGNOSIS — M4000 Postural kyphosis, site unspecified: Secondary | ICD-10-CM | POA: Insufficient documentation

## 2012-02-28 DIAGNOSIS — M5412 Radiculopathy, cervical region: Secondary | ICD-10-CM

## 2012-02-28 DIAGNOSIS — M542 Cervicalgia: Secondary | ICD-10-CM | POA: Insufficient documentation

## 2012-02-28 DIAGNOSIS — G8929 Other chronic pain: Secondary | ICD-10-CM | POA: Insufficient documentation

## 2012-02-28 DIAGNOSIS — M5380 Other specified dorsopathies, site unspecified: Secondary | ICD-10-CM | POA: Insufficient documentation

## 2012-02-28 DIAGNOSIS — M5020 Other cervical disc displacement, unspecified cervical region: Secondary | ICD-10-CM | POA: Insufficient documentation

## 2012-02-28 DIAGNOSIS — M5030 Other cervical disc degeneration, unspecified cervical region: Secondary | ICD-10-CM | POA: Insufficient documentation

## 2012-03-08 ENCOUNTER — Other Ambulatory Visit: Payer: Self-pay | Admitting: Internal Medicine

## 2012-03-08 DIAGNOSIS — M5412 Radiculopathy, cervical region: Secondary | ICD-10-CM

## 2012-03-29 ENCOUNTER — Telehealth: Payer: Self-pay | Admitting: Internal Medicine

## 2012-03-29 ENCOUNTER — Other Ambulatory Visit: Payer: Self-pay | Admitting: Internal Medicine

## 2012-03-29 DIAGNOSIS — M5412 Radiculopathy, cervical region: Secondary | ICD-10-CM

## 2012-03-29 NOTE — Telephone Encounter (Signed)
Referral order placed.

## 2012-03-29 NOTE — Telephone Encounter (Signed)
Patient called and left voice message stating he was scheduled with Dr Kathryne Hitch for 03/19/2012 and was cancelled by their office at the last minute. His message stated that he was rescheduled for 03/22/2012, however he was unable to make the appointment and has attempted to reschedule with their office. He states that he has not received a return phone call regarding appointment. He would like to know if Dr Rodena Medin would refer him to another provider.

## 2012-03-30 NOTE — Telephone Encounter (Signed)
Call placed to patient at [phone], no answer. A detailed voice message was left informing patient of referral placement. A voice message was left advising patient to return phone call if he has not heard from the office within one week to check the status of referral.

## 2012-04-02 ENCOUNTER — Other Ambulatory Visit: Payer: Self-pay | Admitting: Internal Medicine

## 2012-04-02 NOTE — Telephone Encounter (Signed)
Rx refill sent to pharmacy. 

## 2012-04-20 ENCOUNTER — Other Ambulatory Visit: Payer: Self-pay | Admitting: Internal Medicine

## 2012-04-20 ENCOUNTER — Other Ambulatory Visit: Payer: Self-pay | Admitting: *Deleted

## 2012-04-20 MED ORDER — METFORMIN HCL ER 500 MG PO TB24: 500.0000 mg | ORAL_TABLET | Freq: Two times a day (BID) | ORAL | Status: DC

## 2012-04-20 NOTE — Telephone Encounter (Signed)
[facility]

## 2012-05-20 ENCOUNTER — Other Ambulatory Visit: Payer: Self-pay | Admitting: Internal Medicine

## 2012-05-30 ENCOUNTER — Ambulatory Visit: Payer: Federal, State, Local not specified - PPO | Admitting: Internal Medicine

## 2012-06-13 ENCOUNTER — Ambulatory Visit: Payer: Federal, State, Local not specified - PPO | Admitting: Physical Therapy

## 2012-06-14 ENCOUNTER — Encounter: Payer: Self-pay | Admitting: Internal Medicine

## 2012-06-14 ENCOUNTER — Ambulatory Visit (INDEPENDENT_AMBULATORY_CARE_PROVIDER_SITE_OTHER): Payer: Federal, State, Local not specified - PPO | Admitting: Internal Medicine

## 2012-06-14 ENCOUNTER — Telehealth: Payer: Self-pay | Admitting: Internal Medicine

## 2012-06-14 VITALS — BP 110/86 | HR 71 | Temp 98.7°F | Resp 16 | Ht 67.5 in | Wt 179.0 lb

## 2012-06-14 DIAGNOSIS — E119 Type 2 diabetes mellitus without complications: Secondary | ICD-10-CM

## 2012-06-14 DIAGNOSIS — M5412 Radiculopathy, cervical region: Secondary | ICD-10-CM

## 2012-06-14 DIAGNOSIS — E785 Hyperlipidemia, unspecified: Secondary | ICD-10-CM

## 2012-06-14 DIAGNOSIS — R7309 Other abnormal glucose: Secondary | ICD-10-CM

## 2012-06-14 DIAGNOSIS — I10 Essential (primary) hypertension: Secondary | ICD-10-CM

## 2012-06-14 LAB — BASIC METABOLIC PANEL
BUN: 15 mg/dL (ref 6–23)
CO2: 25 mEq/L (ref 19–32)
Calcium: 9.9 mg/dL (ref 8.4–10.5)
Creat: 0.96 mg/dL (ref 0.50–1.35)
Glucose, Bld: 106 mg/dL — ABNORMAL HIGH (ref 70–99)

## 2012-06-14 LAB — CBC
Hemoglobin: 15.7 g/dL (ref 13.0–17.0)
MCH: 31 pg (ref 26.0–34.0)
MCHC: 35 g/dL (ref 30.0–36.0)
Platelets: 236 10*3/uL (ref 150–400)
RDW: 13 % (ref 11.5–15.5)

## 2012-06-14 MED ORDER — GABAPENTIN 100 MG PO CAPS: 100.0000 mg | ORAL_CAPSULE | Freq: Every day | ORAL | Status: DC

## 2012-06-14 NOTE — Progress Notes (Signed)
  Subjective:    Patient ID: Darren Salazar, male    DOB: Mar 25, 1971, 41 y.o.   MRN: [id number]  HPI Pt presents to clinic for followup of multiple medical problems. Now seeing surgery for cervical radiculopathy. Symptoms stable. No focal weakness. No recommendation for surgical intervention per patient. Blood pressure reviewed as normotensive. Tolerating statin therapy without myalgias or abnormalities.  Past Medical History  Diagnosis Date  . Diabetes mellitus type II   . Hypertension   . History of cardiovascular stress test 2009    normal   Past Surgical History  Procedure Date  . Other surgical history 2001     cyst removal behind left ear    reports that he has never smoked. He has never used smokeless tobacco. He reports that he does not drink alcohol or use illicit drugs. family history includes Diabetes in his father and Hypertension in his father and mother.  There is no history of Other. No Known Allergies    Review of Systems see hpi     Objective:   Physical Exam  Physical Exam  Nursing note and vitals reviewed. Constitutional: Appears well-developed and well-nourished. No distress.  HENT:  Head: Normocephalic and atraumatic.  Right Ear: External ear normal.  Left Ear: External ear normal.  Eyes: Conjunctivae are normal. No scleral icterus.  Neck: Neck supple. Carotid bruit is not present.  Cardiovascular: Normal rate, regular rhythm and normal heart sounds.  Exam reveals no gallop and no friction rub.   No murmur heard. Pulmonary/Chest: Effort normal and breath sounds normal. No respiratory distress. He has no wheezes. no rales.  Lymphadenopathy:    He has no cervical adenopathy.  Neurological:Alert.  Skin: Skin is warm and dry. Not diaphoretic.  Psychiatric: Has a normal mood and affect.       Assessment & Plan:

## 2012-06-14 NOTE — Telephone Encounter (Signed)
Please schedule fasting labs prior to next visit  Chem7, a1c-250.00 and lipid/lft-272.4  Future orders entered and given to the lab.

## 2012-06-14 NOTE — Patient Instructions (Signed)
Please schedule fasting labs prior to next visit Chem7, a1c-250.00 and lipid/lft-272.4 

## 2012-06-15 LAB — HEMOGLOBIN A1C
Hgb A1c MFr Bld: 6.2 % — ABNORMAL HIGH (ref <5.7)
Mean Plasma Glucose: 131 mg/dL — ABNORMAL HIGH (ref <117)

## 2012-06-18 ENCOUNTER — Ambulatory Visit: Payer: Federal, State, Local not specified - PPO | Attending: Neurosurgery | Admitting: Physical Therapy

## 2012-06-18 DIAGNOSIS — IMO0001 Reserved for inherently not codable concepts without codable children: Secondary | ICD-10-CM | POA: Insufficient documentation

## 2012-06-18 DIAGNOSIS — M542 Cervicalgia: Secondary | ICD-10-CM | POA: Insufficient documentation

## 2012-06-18 DIAGNOSIS — M2569 Stiffness of other specified joint, not elsewhere classified: Secondary | ICD-10-CM | POA: Insufficient documentation

## 2012-06-25 ENCOUNTER — Ambulatory Visit: Payer: Federal, State, Local not specified - PPO | Admitting: Physical Therapy

## 2012-06-28 NOTE — Assessment & Plan Note (Signed)
Stable. Followed by surgery. Refill Neurontin

## 2012-06-28 NOTE — Assessment & Plan Note (Signed)
Normotensive and stable. Continue current regimen. Monitor bp as outpt and followup in clinic as scheduled.  

## 2012-06-28 NOTE — Assessment & Plan Note (Signed)
Obtain CBC Chem-7 and A1c 

## 2012-07-03 ENCOUNTER — Ambulatory Visit: Payer: Federal, State, Local not specified - PPO | Admitting: Physical Therapy

## 2012-07-09 ENCOUNTER — Ambulatory Visit: Payer: Federal, State, Local not specified - PPO | Admitting: Physical Therapy

## 2012-07-16 ENCOUNTER — Ambulatory Visit: Payer: Federal, State, Local not specified - PPO | Attending: Neurosurgery | Admitting: Physical Therapy

## 2012-07-16 DIAGNOSIS — IMO0001 Reserved for inherently not codable concepts without codable children: Secondary | ICD-10-CM | POA: Insufficient documentation

## 2012-07-16 DIAGNOSIS — M542 Cervicalgia: Secondary | ICD-10-CM | POA: Insufficient documentation

## 2012-07-16 DIAGNOSIS — M2569 Stiffness of other specified joint, not elsewhere classified: Secondary | ICD-10-CM | POA: Insufficient documentation

## 2012-08-10 ENCOUNTER — Other Ambulatory Visit: Payer: Self-pay | Admitting: Internal Medicine

## 2012-08-14 ENCOUNTER — Ambulatory Visit (INDEPENDENT_AMBULATORY_CARE_PROVIDER_SITE_OTHER): Payer: Federal, State, Local not specified - PPO | Admitting: Internal Medicine

## 2012-08-14 ENCOUNTER — Encounter: Payer: Self-pay | Admitting: Internal Medicine

## 2012-08-14 ENCOUNTER — Telehealth: Payer: Self-pay | Admitting: Internal Medicine

## 2012-08-14 VITALS — BP 114/78 | HR 75 | Temp 98.7°F | Resp 16 | Wt 182.8 lb

## 2012-08-14 DIAGNOSIS — J40 Bronchitis, not specified as acute or chronic: Secondary | ICD-10-CM

## 2012-08-14 MED ORDER — AMOXICILLIN-POT CLAVULANATE 875-125 MG PO TABS: 1.0000 | ORAL_TABLET | Freq: Two times a day (BID) | ORAL | Status: AC

## 2012-08-14 NOTE — Telephone Encounter (Signed)
Caller: Darren Salazar/Patient; Patient Name: Darren Salazar; PCP: Marguarite Arbour (Adults only); Best Callback Phone Number: [phone] [name] developed a cough and nasal congestion on 08/10/12.  Coughing up dark green sputum.  C/o ear congestion.  Afebrile.  Utilized URI Guideline.  See PCP within 24 hrs due to "Productive cough with colored sputum".  Scheduled appt for today at 1345 with Dr. Rodena Medin.  Parameters reviewed concerning when to call back.

## 2012-08-14 NOTE — Assessment & Plan Note (Signed)
Begin course of Augmentin. Followup if no improvement or worsening.

## 2012-08-14 NOTE — Progress Notes (Signed)
  Subjective:    Patient ID: Darren Salazar, male    DOB: 03/12/1971, 41 y.o.   MRN: [id number]  HPI patient presents clinic for evaluation of cough. Notes over one week history of cough productive for green brown sputum. Today he noticed tinges of red and sputum. No shortness of breath fever chills or wheezing. No alleviating or exacerbating factors. Taking no medication for the problem. No other complaints.  Past Medical History  Diagnosis Date  . Diabetes mellitus type II   . Hypertension   . History of cardiovascular stress test 2009    normal   Past Surgical History  Procedure Date  . Other surgical history 2001     cyst removal behind left ear    reports that he has never smoked. He has never used smokeless tobacco. He reports that he does not drink alcohol or use illicit drugs. family history includes Diabetes in his father and Hypertension in his father and mother.  There is no history of Other. No Known Allergies   Review of Systems see history of present illness     Objective:   Physical Exam  Constitutional: He appears well-developed and well-nourished. No distress.  HENT:  Head: Normocephalic and atraumatic.  Right Ear: External ear normal.  Left Ear: External ear normal.  Nose: Nose normal.  Mouth/Throat: Oropharynx is clear and moist. No oropharyngeal exudate.  Eyes: Conjunctivae normal are normal. No scleral icterus.  Neck: Neck supple.  Cardiovascular: Normal rate, regular rhythm and normal heart sounds.  Exam reveals no gallop and no friction rub.   No murmur heard. Pulmonary/Chest: Effort normal and breath sounds normal. No respiratory distress. He has no wheezes. He has no rales.  Lymphadenopathy:    He has no cervical adenopathy.  Neurological: He is alert.  Skin: Skin is warm and dry. He is not diaphoretic.  Psychiatric: He has a normal mood and affect.          Assessment & Plan:

## 2012-09-18 LAB — HEPATIC FUNCTION PANEL
Albumin: 4.6 g/dL (ref 3.5–5.2)
Total Bilirubin: 0.5 mg/dL (ref 0.3–1.2)
Total Protein: 7.2 g/dL (ref 6.0–8.3)

## 2012-09-18 LAB — LIPID PANEL
Cholesterol: 150 mg/dL (ref 0–200)
HDL: 52 mg/dL (ref >39)
Total CHOL/HDL Ratio: 2.9 Ratio

## 2012-09-18 LAB — HEMOGLOBIN A1C: Mean Plasma Glucose: 128 mg/dL — ABNORMAL HIGH (ref <117)

## 2012-09-18 LAB — BASIC METABOLIC PANEL
BUN: 19 mg/dL (ref 6–23)
Calcium: 9.5 mg/dL (ref 8.4–10.5)
Creat: 0.93 mg/dL (ref 0.50–1.35)

## 2012-09-18 NOTE — Addendum Note (Signed)
Addended by: Mervin Kung A on: 09/18/2012 09:01 AM   Modules accepted: Orders

## 2012-09-18 NOTE — Telephone Encounter (Signed)
Pt presented to the lab. Future orders released. 

## 2012-09-21 ENCOUNTER — Telehealth: Payer: Self-pay | Admitting: Internal Medicine

## 2012-09-21 ENCOUNTER — Encounter: Payer: Self-pay | Admitting: Internal Medicine

## 2012-09-21 ENCOUNTER — Ambulatory Visit (INDEPENDENT_AMBULATORY_CARE_PROVIDER_SITE_OTHER): Payer: Federal, State, Local not specified - PPO | Admitting: Internal Medicine

## 2012-09-21 VITALS — BP 118/82 | HR 72 | Temp 98.7°F | Resp 16 | Wt 178.0 lb

## 2012-09-21 DIAGNOSIS — E119 Type 2 diabetes mellitus without complications: Secondary | ICD-10-CM

## 2012-09-21 DIAGNOSIS — Z23 Encounter for immunization: Secondary | ICD-10-CM

## 2012-09-21 DIAGNOSIS — R7309 Other abnormal glucose: Secondary | ICD-10-CM

## 2012-09-21 DIAGNOSIS — E785 Hyperlipidemia, unspecified: Secondary | ICD-10-CM

## 2012-09-21 NOTE — Telephone Encounter (Signed)
Lab order week of 01-10-2013 Chem7, a1c-250.00

## 2012-09-21 NOTE — Assessment & Plan Note (Signed)
Excellent control. Diabetic eye exam referral

## 2012-09-21 NOTE — Assessment & Plan Note (Signed)
Good control. Continue statin tx 

## 2012-09-21 NOTE — Patient Instructions (Signed)
Please schedule non fasting labs prior to next visit Chem7, a1c-250.00

## 2012-09-21 NOTE — Addendum Note (Signed)
Addended by: Regis Bill on: 09/21/2012 03:52 PM   Modules accepted: Orders

## 2012-09-21 NOTE — Progress Notes (Signed)
  Subjective:    Patient ID: Darren Salazar, male    DOB: [birth year], 41 y.o.   MRN: [id number]  HPI Pt presents to clinic for followup of multiple medical problems. Doing well. Resolved URI from last month with abx. Cervical radiculopathy sx's have been minimal since epidural injection. Reviewed continued good glycemic and cholesterol control. No active complaints. Needs referral for diabetic eye exam.  Past Medical History  Diagnosis Date  . Diabetes mellitus type II   . Hypertension   . History of cardiovascular stress test 2009    normal   Past Surgical History  Procedure Date  . Other surgical history 2001     cyst removal behind left ear    reports that he has never smoked. He has never used smokeless tobacco. He reports that he does not drink alcohol or use illicit drugs. family history includes Diabetes in his father and Hypertension in his father and mother.  There is no history of Other. No Known Allergies    Review of Systems see hpi     Objective:   Physical Exam  Physical Exam  Nursing note and vitals reviewed. Constitutional: Appears well-developed and well-nourished. No distress.  HENT:  Head: Normocephalic and atraumatic.  Right Ear: External ear normal.  Left Ear: External ear normal.  Eyes: Conjunctivae are normal. No scleral icterus.  Neck: Neck supple. Carotid bruit is not present.  Cardiovascular: Normal rate, regular rhythm and normal heart sounds.  Exam reveals no gallop and no friction rub.   No murmur heard. Pulmonary/Chest: Effort normal and breath sounds normal. No respiratory distress. He has no wheezes. no rales.  Lymphadenopathy:    He has no cervical adenopathy.  Neurological:Alert.  Skin: Skin is warm and dry. Not diaphoretic.  Psychiatric: Has a normal mood and affect.        Assessment & Plan:

## 2012-09-23 ENCOUNTER — Other Ambulatory Visit: Payer: Self-pay | Admitting: Internal Medicine

## 2012-09-24 NOTE — Telephone Encounter (Signed)
Rx to pharmacy/[facility] 

## 2012-12-05 ENCOUNTER — Other Ambulatory Visit: Payer: Self-pay | Admitting: Internal Medicine

## 2012-12-11 ENCOUNTER — Ambulatory Visit: Payer: Federal, State, Local not specified - PPO | Admitting: Family

## 2012-12-31 ENCOUNTER — Ambulatory Visit
Admission: RE | Admit: 2012-12-31 | Discharge: 2012-12-31 | Disposition: A | Discharge status: Home / Self Care | Payer: Federal, State, Local not specified - PPO | Source: Ambulatory Visit | Attending: Sports Medicine | Admitting: Sports Medicine

## 2012-12-31 ENCOUNTER — Ambulatory Visit (INDEPENDENT_AMBULATORY_CARE_PROVIDER_SITE_OTHER): Payer: Federal, State, Local not specified - PPO | Admitting: Sports Medicine

## 2012-12-31 ENCOUNTER — Encounter: Payer: Self-pay | Admitting: Sports Medicine

## 2012-12-31 VITALS — BP 134/78 | HR 96 | Ht 68.0 in | Wt 167.0 lb

## 2012-12-31 DIAGNOSIS — S86899A Other injury of other muscle(s) and tendon(s) at lower leg level, unspecified leg, initial encounter: Secondary | ICD-10-CM

## 2012-12-31 DIAGNOSIS — M79604 Pain in right leg: Secondary | ICD-10-CM

## 2012-12-31 DIAGNOSIS — IMO0002 Reserved for concepts with insufficient information to code with codable children: Secondary | ICD-10-CM

## 2012-12-31 DIAGNOSIS — M79609 Pain in unspecified limb: Secondary | ICD-10-CM

## 2012-12-31 NOTE — Progress Notes (Signed)
Subjective:  Darren Salazar is a very pleasant 42 yo [place] American male with past history of diabetes, hypertension and hyperlipidemia who presents for evaluation of right lower leg pain.  This pain started insidiously 3-4 weeks ago and has been getting progressively more painful.  Pain is described as 10/10 sharp/searing pain which occurs nocturnally on days in which he runs.  This pain will awaken him from sleep at night where he is able to bear weight, and take Aleve which helps to dull the pain.  During runs he does not have any pain, however, he does a dull achy pain approximately 1 hour after runs.  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  He increased his run distance training from 2 miles to 3-5 miles 3-4 days a week approximately 6 months ago.  He has been wearing the same shoes x 6 months and after the onset of pain switched to a new pair of shoes.  He denies any fevers/chills, night sweats, hip, knee or ankle pain, and never had trauma to the leg.  Does admit to intentional 15lb weight loss over the last 4 months.   Past Medical History  Diagnosis Date  . Diabetes mellitus type II   . Hypertension   . History of cardiovascular stress test 2009    normal   Past Surgical History  Procedure Laterality Date  . Other surgical history  2001     cyst removal behind left ear   History  Substance Use Topics  . Smoking status: Never Smoker   . Smokeless tobacco: Never Used  . Alcohol Use: No   No Known Allergies    Objective:  BP 134/78  Pulse 96  Ht 5\' 8"  (1.727 m)  Wt 167 lb (75.751 kg)  BMI 25.4 kg/m2  Gen:  NAD, well appearing.  CV:  RRR, no murmurs.  2+ peripheral pulses.  PULM:  CTAB, no adventitial sounds.  Skin:  Well perfused, warm and dry.  No nail abnormalities.  Psych:  Alert and oriented x 3.  Dressed appropriately. Normal sentences.   Msk:  Inspection of bilateral lower extremities with no obvious osseous deformity.   Posteriorly, with normal stance there appears to be abduction of the right forefoot with no pes planus or pes cavus.  TTP along the right middle third tibia along the medial tibial border, no palpable crepitus, step offs or masses. No tenderness to palpation directly over the tibia itself. Negative hop test.  Decreased external rotation of the right hip, otherwise full ROM in bilateral lower extremities. Negative log roll.  Strength 5/5 with hip abduction, ankle dorsiflexion/plantarflexion and eversion/inversion.  DTR's 2/4 bilateral lower extremities.    Gait evaluation:  Running stance revealed forefoot striker with no obvious trendelenburg, genu deformities or supination/pronation.  Overall, great running stance with very symmetric running position.    Ultrasound right lower extremity:  Performed and interpreted ultrasound of the right tibia in both short/long views which demonstrated hypoechogenicity along the anterior tibia without evidence of cortical irregularities, masses or neovascularization.    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  Differential includes tibial stress fracture, or bony abnormality, however, neither were evident on ultrasound.    Plan:  1.  Medial Tibial Stress Syndrome 2. Slight out toeing of  the right foot secondary to tight IT band  --Counseled patient regarding diagnosis and condition and reassured him for future training. Given his night pain I will obtain an AP and lateral plain x-ray of the right tib-fib.  He was given a compression sleeve in office with education regarding wearing it during all activities.  I asked him to please stop running for a period of 2 weeks and continue cycling and swimming.  This will allow adequate rest and recovery. He will start daily IT band  stretching. He is scheduled for follow up in 4 weeks and instructed to please cancel if symptoms have resolved. If symptoms persist, I would consider merits of further diagnostic imaging.  Stephanie Coup. Arvilla Market, DO

## 2013-01-24 ENCOUNTER — Ambulatory Visit: Payer: Federal, State, Local not specified - PPO | Admitting: Internal Medicine

## 2013-01-28 ENCOUNTER — Encounter (HOSPITAL_COMMUNITY): Payer: Self-pay | Admitting: Emergency Medicine

## 2013-01-28 ENCOUNTER — Emergency Department (HOSPITAL_COMMUNITY)
Admission: EM | Admit: 2013-01-28 | Discharge: 2013-01-28 | Disposition: A | Discharge status: Home / Self Care | Payer: Federal, State, Local not specified - PPO | Source: Home / Self Care | Attending: Emergency Medicine | Admitting: Emergency Medicine

## 2013-01-28 DIAGNOSIS — S0990XA Unspecified injury of head, initial encounter: Secondary | ICD-10-CM

## 2013-01-28 NOTE — ED Notes (Signed)
Hit top of head into a shelf yesterday evening.  No loc, but felt "whoozy" for about 20 minutes.  Today after his work out became very dizzy, did not pass out, but now has a bad headache.  Denies vision changes.
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
## 2013-01-28 NOTE — ED Provider Notes (Signed)
Medical screening examination/treatment/procedure(s) were performed by non-physician practitioner and as supervising physician I was immediately available for consultation/collaboration.  Raynald Blend, MD 01/28/13 [id number]

## 2013-01-31 ENCOUNTER — Ambulatory Visit: Payer: Federal, State, Local not specified - PPO | Admitting: Sports Medicine

## 2013-01-31 ENCOUNTER — Ambulatory Visit: Payer: Federal, State, Local not specified - PPO | Admitting: Family Medicine

## 2013-02-08 ENCOUNTER — Telehealth: Payer: Self-pay | Admitting: *Deleted

## 2013-02-08 DIAGNOSIS — R7309 Other abnormal glucose: Secondary | ICD-10-CM

## 2013-02-08 LAB — BASIC METABOLIC PANEL
BUN: 14 mg/dL (ref 6–23)
CO2: 26 mEq/L (ref 19–32)
Calcium: 9.5 mg/dL (ref 8.4–10.5)
Glucose, Bld: 113 mg/dL — ABNORMAL HIGH (ref 70–99)
Potassium: 4.1 mEq/L (ref 3.5–5.3)
Sodium: 134 mEq/L — ABNORMAL LOW (ref 135–145)

## 2013-02-08 LAB — HEMOGLOBIN A1C: Hgb A1c MFr Bld: 5.8 % — ABNORMAL HIGH (ref <5.7)

## 2013-02-08 NOTE — Telephone Encounter (Signed)
Pt presented to the lab. Orders entered per 09/2012 office note as below:  Please schedule non fasting labs prior to next visit  Chem7, [id number]

## 2013-02-11 ENCOUNTER — Encounter: Payer: Self-pay | Admitting: Family Medicine

## 2013-02-11 ENCOUNTER — Ambulatory Visit (INDEPENDENT_AMBULATORY_CARE_PROVIDER_SITE_OTHER): Payer: Federal, State, Local not specified - PPO | Admitting: Family Medicine

## 2013-02-11 DIAGNOSIS — M5412 Radiculopathy, cervical region: Secondary | ICD-10-CM

## 2013-02-11 DIAGNOSIS — R7309 Other abnormal glucose: Secondary | ICD-10-CM

## 2013-02-11 DIAGNOSIS — I10 Essential (primary) hypertension: Secondary | ICD-10-CM

## 2013-02-11 DIAGNOSIS — E785 Hyperlipidemia, unspecified: Secondary | ICD-10-CM

## 2013-02-11 DIAGNOSIS — T6391XA Toxic effect of contact with unspecified venomous animal, accidental (unintentional), initial encounter: Secondary | ICD-10-CM

## 2013-02-11 DIAGNOSIS — M542 Cervicalgia: Secondary | ICD-10-CM

## 2013-02-11 DIAGNOSIS — T63461A Toxic effect of venom of wasps, accidental (unintentional), initial encounter: Secondary | ICD-10-CM

## 2013-02-11 MED ORDER — EPINEPHRINE 0.3 MG/0.3ML IJ SOAJ: 0.3000 mg | Freq: Once | INTRAMUSCULAR | Status: DC

## 2013-02-11 MED ORDER — GABAPENTIN 100 MG PO CAPS: 100.0000 mg | ORAL_CAPSULE | Freq: Every day | ORAL | Status: DC | PRN
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
## 2013-02-11 NOTE — Assessment & Plan Note (Signed)
Well controlled on current meds, no changes 

## 2013-02-11 NOTE — Assessment & Plan Note (Signed)
Tolerating Atorvastatin, avoid trans fats 

## 2013-02-11 NOTE — Patient Instructions (Addendum)
Next visit, annual, lipid, renal, hepatic, tsh, hgba1c    Diabetes and Exercise Regular exercise is important and can help:   Control blood glucose (sugar).  Decrease blood pressure.    Control blood lipids (cholesterol, triglycerides).  Improve overall health. BENEFITS FROM EXERCISE  Improved fitness.  Improved flexibility.  Improved endurance.  Increased bone density.  Weight control.  Increased muscle strength.  Decreased body fat.  Improvement of the body's use of insulin, a hormone.  Increased insulin sensitivity.  Reduction of insulin needs.  Reduced stress and tension.  Helps you feel better. People with diabetes who add exercise to their lifestyle gain additional benefits, including:  Weight loss.  Reduced appetite.  Improvement of the body's use of blood glucose.  Decreased risk factors for heart disease:  Lowering of cholesterol and triglycerides.  Raising the level of good cholesterol (high-density lipoproteins, HDL).  Lowering blood sugar.  Decreased blood pressure. TYPE 1 DIABETES AND EXERCISE  Exercise will usually lower your blood glucose.  If blood glucose is greater than 240 mg/dl, check urine ketones. If ketones are present, do not exercise.  Location of the insulin injection sites may need to be adjusted with exercise. Avoid injecting insulin into areas of the body that will be exercised. For example, avoid injecting insulin into:  The arms when playing tennis.  The legs when jogging. For more information, discuss this with your caregiver.  Keep a record of:  Food intake.  Type and amount of exercise.  Expected peak times of insulin action.  Blood glucose levels. Do this before, during, and after exercise. Review your records with your caregiver. This will help you to develop guidelines for adjusting food intake and insulin amounts.  TYPE 2 DIABETES AND EXERCISE  Regular physical activity can help control blood  glucose.  Exercise is important because it may:  Increase the body's sensitivity to insulin.  Improve blood glucose control.  Exercise reduces the risk of heart disease. It decreases serum cholesterol and triglycerides. It also lowers blood pressure.  Those who take insulin or oral hypoglycemic agents should watch for signs of hypoglycemia. These signs include dizziness, shaking, sweating, chills, and confusion.  Body water is lost during exercise. It must be replaced. This will help to avoid loss of body fluids (dehydration) or heat stroke. Be sure to talk to your caregiver before starting an exercise program to make sure it is safe for you. Remember, any activity is better than none.  Document Released: 12/17/2003 Document Revised: 12/19/2011 Document Reviewed: 04/02/2009 [facility] Patient Information 2013 [place], [place].

## 2013-02-11 NOTE — Assessment & Plan Note (Signed)
Does well most of the time, Gabapentin prn works well most of the time, given refill today

## 2013-02-11 NOTE — Assessment & Plan Note (Signed)
Diet and lifestyle changes have been helpful, continue Metformin at bid but any signs of hypoglycemia will drop Metformin to just qhs and monitor bs.

## 2013-03-19 ENCOUNTER — Other Ambulatory Visit: Payer: Self-pay | Admitting: Family Medicine

## 2013-04-18 ENCOUNTER — Other Ambulatory Visit: Payer: Self-pay

## 2013-04-22 ENCOUNTER — Encounter: Payer: Self-pay | Admitting: Family Medicine

## 2013-04-22 ENCOUNTER — Ambulatory Visit (INDEPENDENT_AMBULATORY_CARE_PROVIDER_SITE_OTHER): Payer: Federal, State, Local not specified - PPO | Admitting: Family Medicine

## 2013-04-22 VITALS — BP 120/90 | HR 67 | Temp 98.2°F | Ht 67.5 in | Wt 178.0 lb

## 2013-04-22 DIAGNOSIS — M542 Cervicalgia: Secondary | ICD-10-CM

## 2013-04-22 DIAGNOSIS — R51 Headache: Secondary | ICD-10-CM

## 2013-04-22 DIAGNOSIS — R519 Headache, unspecified: Secondary | ICD-10-CM | POA: Insufficient documentation

## 2013-04-22 DIAGNOSIS — M5412 Radiculopathy, cervical region: Secondary | ICD-10-CM

## 2013-04-22 HISTORY — DX: Headache, unspecified: R51.9

## 2013-04-22 MED ORDER — CYCLOBENZAPRINE HCL 5 MG PO TABS: 5.0000 mg | ORAL_TABLET | Freq: Every evening | ORAL | Status: DC | PRN

## 2013-04-22 NOTE — Patient Instructions (Signed)
Excedrine Migraine or Tension as needed for headaches, 64 oz and 7-8 hours of sleep a night  Migraine Headache A migraine headache is an intense, throbbing pain on one or both sides of your head. A migraine can last for 30 minutes to several hours. CAUSES  The exact cause of a migraine headache is not always known. However, a migraine may be caused when nerves in the brain become irritated and release chemicals that cause inflammation. This causes pain. SYMPTOMS  Pain on one or both sides of your head.  Pulsating or throbbing pain.  Severe pain that prevents daily activities.  Pain that is aggravated by any physical activity.  Nausea, vomiting, or both.  Dizziness.  Pain with exposure to bright lights, loud noises, or activity.  General sensitivity to bright lights, loud noises, or smells. Before you get a migraine, you may get warning signs that a migraine is coming (aura). An aura may include:  Seeing flashing lights.  Seeing bright spots, halos, or zig-zag lines.  Having tunnel vision or blurred vision.  Having feelings of numbness or tingling.  Having trouble talking.  Having muscle weakness. MIGRAINE TRIGGERS  Alcohol.  Smoking.  Stress.  Menstruation.  Aged cheeses.  Foods or drinks that contain nitrates, glutamate, aspartame, or tyramine.  Lack of sleep.  Chocolate.  Caffeine.  Hunger.  Physical exertion.  Fatigue.  Medicines used to treat chest pain (nitroglycerine), birth control pills, estrogen, and some blood pressure medicines. DIAGNOSIS  A migraine headache is often diagnosed based on:  Symptoms.  Physical examination.  A CT scan or MRI of your head. TREATMENT Medicines may be given for pain and nausea. Medicines can also be given to help prevent recurrent migraines.  HOME CARE INSTRUCTIONS  Only take over-the-counter or prescription medicines for pain or discomfort as directed by your caregiver. The use of long-term narcotics is  not recommended.  Lie down in a dark, quiet room when you have a migraine.  Keep a journal to find out what may trigger your migraine headaches. For example, write down:  What you eat and drink.  How much sleep you get.  Any change to your diet or medicines.  Limit alcohol consumption.  Quit smoking if you smoke.  Get 7 to 9 hours of sleep, or as recommended by your caregiver.  Limit stress.  Keep lights dim if bright lights bother you and make your migraines worse. SEEK IMMEDIATE MEDICAL CARE IF:   Your migraine becomes severe.  You have a fever.  You have a stiff neck.  You have vision loss.  You have muscular weakness or loss of muscle control.  You start losing your balance or have trouble walking.  You feel faint or pass out.  You have severe symptoms that are different from your first symptoms. MAKE SURE YOU:   Understand these instructions.  Will watch your condition.  Will get help right away if you are not doing well or get worse. Document Released: 09/26/2005 Document Revised: 12/19/2011 Document Reviewed: 09/16/2011 [facility] Patient Information 2014 [place], [place].
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
## 2013-04-22 NOTE — Assessment & Plan Note (Signed)
Encouraged moist heat and gentle stretching, continue Gabapentin and flexeril prn

## 2013-04-22 NOTE — Assessment & Plan Note (Signed)
Consider post concussive syndrome, noting some memory loss since he hit his head on a metal shelf at his home, referred to neurology for further consideration and encouraged to try Excedrine products prn, discussed need for adequate sleep, improved hydration and small frequent meals.

## 2013-05-08 ENCOUNTER — Ambulatory Visit (INDEPENDENT_AMBULATORY_CARE_PROVIDER_SITE_OTHER): Payer: Federal, State, Local not specified - PPO | Admitting: Neurology

## 2013-05-08 ENCOUNTER — Encounter: Payer: Self-pay | Admitting: Neurology

## 2013-05-08 VITALS — BP 116/84 | HR 84 | Temp 98.3°F | Ht 68.0 in | Wt 179.0 lb

## 2013-05-08 DIAGNOSIS — F0781 Postconcussional syndrome: Secondary | ICD-10-CM

## 2013-05-08 DIAGNOSIS — R51 Headache: Secondary | ICD-10-CM

## 2013-05-08 DIAGNOSIS — G4486 Cervicogenic headache: Secondary | ICD-10-CM

## 2013-05-08 NOTE — Patient Instructions (Addendum)
I think you did likely have residual effect from the concussion.  It also could be triggered by your occasional neck pain.  If headache returns, consider deep tissue massage of the neck.  May follow up as needed.
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
## 2013-05-27 ENCOUNTER — Other Ambulatory Visit: Payer: Self-pay | Admitting: Internal Medicine

## 2013-06-14 ENCOUNTER — Ambulatory Visit: Payer: Federal, State, Local not specified - PPO | Admitting: Family Medicine

## 2013-07-04 ENCOUNTER — Ambulatory Visit (INDEPENDENT_AMBULATORY_CARE_PROVIDER_SITE_OTHER): Payer: Federal, State, Local not specified - PPO | Admitting: Family Medicine

## 2013-07-04 ENCOUNTER — Encounter: Payer: Self-pay | Admitting: Family Medicine

## 2013-07-04 VITALS — BP 122/84 | HR 64 | Temp 97.4°F | Ht 67.5 in | Wt 180.0 lb

## 2013-07-04 DIAGNOSIS — I10 Essential (primary) hypertension: Secondary | ICD-10-CM

## 2013-07-04 DIAGNOSIS — M5412 Radiculopathy, cervical region: Secondary | ICD-10-CM

## 2013-07-04 DIAGNOSIS — R739 Hyperglycemia, unspecified: Secondary | ICD-10-CM

## 2013-07-04 DIAGNOSIS — R51 Headache: Secondary | ICD-10-CM

## 2013-07-04 DIAGNOSIS — H60399 Other infective otitis externa, unspecified ear: Secondary | ICD-10-CM

## 2013-07-04 DIAGNOSIS — R7309 Other abnormal glucose: Secondary | ICD-10-CM

## 2013-07-04 DIAGNOSIS — R519 Headache, unspecified: Secondary | ICD-10-CM

## 2013-07-04 DIAGNOSIS — Z0000 Encounter for general adult medical examination without abnormal findings: Secondary | ICD-10-CM

## 2013-07-04 DIAGNOSIS — E785 Hyperlipidemia, unspecified: Secondary | ICD-10-CM

## 2013-07-04 LAB — CBC
Hemoglobin: 15.8 g/dL (ref 13.0–17.0)
MCH: 30.8 pg (ref 26.0–34.0)
MCHC: 34.8 g/dL (ref 30.0–36.0)
MCV: 88.5 fL (ref 78.0–100.0)
RBC: 5.13 MIL/uL (ref 4.22–5.81)

## 2013-07-04 LAB — RENAL FUNCTION PANEL
Albumin: 4.5 g/dL (ref 3.5–5.2)
CO2: 27 mEq/L (ref 19–32)
Calcium: 10 mg/dL (ref 8.4–10.5)
Creat: 0.92 mg/dL (ref 0.50–1.35)
Glucose, Bld: 125 mg/dL — ABNORMAL HIGH (ref 70–99)
Sodium: 135 mEq/L (ref 135–145)

## 2013-07-04 LAB — HEPATIC FUNCTION PANEL
ALT: 32 U/L (ref 0–53)
AST: 24 U/L (ref 0–37)
Albumin: 4.5 g/dL (ref 3.5–5.2)
Bilirubin, Direct: 0.2 mg/dL (ref 0.0–0.3)
Total Protein: 7.2 g/dL (ref 6.0–8.3)

## 2013-07-04 LAB — TSH: TSH: 0.461 u[IU]/mL (ref 0.350–4.500)

## 2013-07-04 LAB — LIPID PANEL: Cholesterol: 170 mg/dL (ref 0–200)

## 2013-07-04 LAB — HEMOGLOBIN A1C
Hgb A1c MFr Bld: 6.3 % — ABNORMAL HIGH (ref <5.7)
Mean Plasma Glucose: 134 mg/dL — ABNORMAL HIGH (ref <117)

## 2013-07-04 MED ORDER — NEOMYCIN-POLYMYXIN-HC 3.5-10000-1 OT SOLN: 3.0000 [drp] | Freq: Two times a day (BID) | OTIC | Status: DC | PRN

## 2013-07-04 NOTE — Patient Instructions (Addendum)
Aspercreme or salon Pas to finger after icing for roughly 10 minutes. Call for referral if concerned   Basic Carbohydrate Counting Basic carbohydrate counting is a way to plan meals. It is done by counting the amount of carbohydrate in foods. Foods that have carbohydrates are starches (grains, beans, starchy vegetables) and sweets. Eating carbohydrates increases blood glucose (sugar) levels. People with diabetes use carbohydrate counting to help keep their blood glucose at a normal level.  COUNTING CARBOHYDRATES IN FOODS The first step in counting carbohydrates is to learn how many carbohydrate servings you should have in every meal. A dietitian can plan this for you. After learning the amount of carbohydrates to include in your meal plan, you can start to choose the carbohydrate-containing foods you want to eat.  There are 2 ways to identify the amount of carbohydrates in the foods you eat.  Read the Nutrition Facts panel on food labels. You need 2 pieces of information from the Nutrition Facts panel to count carbohydrates this way:  Serving size.  Total carbohydrate (in grams). Decide how many servings you will be eating. If it is 1 serving, you will be eating the amount of carbohydrate listed on the panel. If you will be eating 2 servings, you will be eating double the amount of carbohydrate listed on the panel.   Learn serving sizes. A serving size of most carbohydrate-containing foods is about 15 grams (g). Listed below are single serving sizes of common carbohydrate-containing foods:  1 slice bread.   cup unsweetened, dry cereal.   cup hot cereal.   cup rice.   cup mashed potatoes.   cup pasta.  1 cup fresh fruit.   cup canned fruit.  1 cup milk (whole, 2%, or skim).   cup starchy vegetables (peas, corn, or potatoes). Counting carbohydrates this way is similar to looking on the Nutrition Facts panel. Decide how many servings you will eat first. Multiply the number of  servings you eat by 15 g. For example, if you have 2 cups of strawberries, you had 2 servings. That means you had 30 g of carbohydrate (2 servings x 15 g = 30 g). CALCULATING CARBOHYDRATES IN A MEAL Sample dinner  3 oz chicken breast.   cup brown rice.   cup corn.  1 cup fat-free milk.  1 cup strawberries with sugar-free whipped topping. Carbohydrate calculation First, identify the foods that contain carbohydrate:  Rice.  Corn.  Milk.  Strawberries. Calculate the number of servings eaten:  2 servings rice.  1 serving corn.  1 serving milk.  1 serving strawberries. Multiply the number of servings by 15 g:  2 servings rice x 15 g = 30 g.  1 serving corn x 15 g = 15 g.  1 serving milk x 15 g = 15 g.  1 serving strawberries x 15 g = 15 g. Add the amounts to find the total carbohydrates eaten: 30 g + 15 g + 15 g + 15 g = 75 g carbohydrate eaten at dinner. Document Released: 09/26/2005 Document Revised: 12/19/2011 Document Reviewed: 08/12/2011 [facility] Patient Information 2014 [place], [place].
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
## 2013-07-06 ENCOUNTER — Encounter: Payer: Self-pay | Admitting: Family Medicine

## 2013-07-06 DIAGNOSIS — Z0000 Encounter for general adult medical examination without abnormal findings: Secondary | ICD-10-CM

## 2013-07-06 HISTORY — DX: Encounter for general adult medical examination without abnormal findings: Z00.00

## 2013-07-06 NOTE — Assessment & Plan Note (Signed)
Well controlled, no changes 

## 2013-07-06 NOTE — Assessment & Plan Note (Signed)
Doing well encouraged heart healthy diet and increased exercise.

## 2013-07-06 NOTE — Assessment & Plan Note (Signed)
Patient declines flu shot, encouraged heart healthy diet and increased exercise, needs 8 hours of sleep

## 2013-07-06 NOTE — Assessment & Plan Note (Signed)
Improved after steroid injection

## 2013-07-06 NOTE — Assessment & Plan Note (Signed)
Resolved s/p concussion

## 2013-10-14 ENCOUNTER — Other Ambulatory Visit: Payer: Self-pay | Admitting: Family Medicine

## 2013-12-08 ENCOUNTER — Other Ambulatory Visit: Payer: Self-pay | Admitting: Family Medicine

## 2013-12-25 ENCOUNTER — Telehealth: Payer: Self-pay | Admitting: *Deleted

## 2013-12-25 DIAGNOSIS — R7309 Other abnormal glucose: Secondary | ICD-10-CM

## 2013-12-25 DIAGNOSIS — E785 Hyperlipidemia, unspecified: Secondary | ICD-10-CM

## 2013-12-25 DIAGNOSIS — I10 Essential (primary) hypertension: Secondary | ICD-10-CM

## 2013-12-25 LAB — LIPID PANEL
Cholesterol: 151 mg/dL (ref 0–200)
HDL: 49 mg/dL (ref >39)
LDL CALC: 90 mg/dL (ref 0–99)
Total CHOL/HDL Ratio: 3.1 Ratio
Triglycerides: 59 mg/dL (ref <150)
VLDL: 12 mg/dL (ref 0–40)

## 2013-12-25 LAB — CBC
HCT: 45.1 % (ref 39.0–52.0)
HEMOGLOBIN: 15.7 g/dL (ref 13.0–17.0)
MCH: 30.5 pg (ref 26.0–34.0)
MCHC: 34.8 g/dL (ref 30.0–36.0)
MCV: 87.7 fL (ref 78.0–100.0)
PLATELETS: 188 10*3/uL (ref 150–400)
RBC: 5.14 MIL/uL (ref 4.22–5.81)
RDW: 13.2 % (ref 11.5–15.5)
WBC: 3.3 10*3/uL — ABNORMAL LOW (ref 4.0–10.5)

## 2013-12-25 LAB — HEPATIC FUNCTION PANEL
ALK PHOS: 46 U/L (ref 39–117)
ALT: 28 U/L (ref 0–53)
AST: 21 U/L (ref 0–37)
Albumin: 4.3 g/dL (ref 3.5–5.2)
BILIRUBIN INDIRECT: 0.7 mg/dL (ref 0.2–1.2)
Bilirubin, Direct: 0.2 mg/dL (ref 0.0–0.3)
TOTAL PROTEIN: 6.9 g/dL (ref 6.0–8.3)
Total Bilirubin: 0.9 mg/dL (ref 0.2–1.2)

## 2013-12-25 LAB — HEMOGLOBIN A1C
Hgb A1c MFr Bld: 6.2 % — ABNORMAL HIGH (ref <5.7)
Mean Plasma Glucose: 131 mg/dL — ABNORMAL HIGH (ref <117)

## 2013-12-25 LAB — RENAL FUNCTION PANEL
Albumin: 4.3 g/dL (ref 3.5–5.2)
BUN: 15 mg/dL (ref 6–23)
CHLORIDE: 97 meq/L (ref 96–112)
CO2: 29 mEq/L (ref 19–32)
Calcium: 9.2 mg/dL (ref 8.4–10.5)
Creat: 0.9 mg/dL (ref 0.50–1.35)
Glucose, Bld: 116 mg/dL — ABNORMAL HIGH (ref 70–99)
PHOSPHORUS: 3 mg/dL (ref 2.3–4.6)
POTASSIUM: 4 meq/L (ref 3.5–5.3)
Sodium: 133 mEq/L — ABNORMAL LOW (ref 135–145)

## 2013-12-25 NOTE — Telephone Encounter (Signed)
Patient presented to the lab. I could not find and current orders in system. Spoke with pt. He states Provider hand wrote on AVS to return 1 week prior to appt for labs but did not write lab tests on paper. Ordered same labs as previous visit in September 2014.

## 2013-12-26 LAB — TSH: TSH: 0.856 u[IU]/mL (ref 0.350–4.500)

## 2013-12-27 ENCOUNTER — Ambulatory Visit (INDEPENDENT_AMBULATORY_CARE_PROVIDER_SITE_OTHER): Payer: Federal, State, Local not specified - PPO | Admitting: Family Medicine

## 2013-12-27 ENCOUNTER — Encounter: Payer: Self-pay | Admitting: Family Medicine

## 2013-12-27 VITALS — BP 130/90 | HR 80 | Temp 98.5°F | Ht 67.5 in | Wt 183.0 lb

## 2013-12-27 DIAGNOSIS — R7309 Other abnormal glucose: Secondary | ICD-10-CM

## 2013-12-27 DIAGNOSIS — I10 Essential (primary) hypertension: Secondary | ICD-10-CM

## 2013-12-27 DIAGNOSIS — E871 Hypo-osmolality and hyponatremia: Secondary | ICD-10-CM

## 2013-12-27 DIAGNOSIS — M25569 Pain in unspecified knee: Secondary | ICD-10-CM

## 2013-12-27 DIAGNOSIS — E785 Hyperlipidemia, unspecified: Secondary | ICD-10-CM

## 2013-12-27 DIAGNOSIS — M25561 Pain in right knee: Secondary | ICD-10-CM

## 2013-12-27 DIAGNOSIS — D709 Neutropenia, unspecified: Secondary | ICD-10-CM

## 2013-12-27 LAB — RENAL FUNCTION PANEL
Albumin: 4.5 g/dL (ref 3.5–5.2)
BUN: 19 mg/dL (ref 6–23)
CHLORIDE: 100 meq/L (ref 96–112)
CO2: 27 meq/L (ref 19–32)
Calcium: 9.7 mg/dL (ref 8.4–10.5)
Creat: 1.01 mg/dL (ref 0.50–1.35)
Glucose, Bld: 132 mg/dL — ABNORMAL HIGH (ref 70–99)
PHOSPHORUS: 3.3 mg/dL (ref 2.3–4.6)
POTASSIUM: 3.9 meq/L (ref 3.5–5.3)
SODIUM: 136 meq/L (ref 135–145)

## 2013-12-27 NOTE — Patient Instructions (Signed)
Neutropenia Neutropenia is a condition that occurs when the level of a certain type of white blood cell (neutrophil) in your body becomes lower than normal. Neutrophils are made in the bone marrow and fight infections. These cells protect against bacteria and viruses. The fewer neutrophils you have, and the longer your body remains without them, the greater your risk of getting a severe infection becomes. CAUSES  The cause of neutropenia may be hard to determine. However, it is usually due to 3 main problems:   Decreased production of neutrophils. This may be due to:  Certain medicines such as chemotherapy.  Genetic problems.  Cancer.  Radiation treatments.  Vitamin deficiency.  Some pesticides.  Increased destruction of neutrophils. This may be due to:  Overwhelming infections.  Hemolytic anemia. This is when the body destroys its own blood cells.  Chemotherapy.  Neutrophils moving to areas of the body where they cannot fight infections. This may be due to:  Dialysis procedures.  Conditions where the spleen becomes enlarged. Neutrophils are held in the spleen and are not available to the rest of the body.  Overwhelming infections. The neutrophils are held in the area of the infection and are not available to the rest of the body. SYMPTOMS  There are no specific symptoms of neutropenia. The lack of neutrophils can result in an infection, and an infection can cause various problems. DIAGNOSIS  Diagnosis is made by a blood test. A complete blood count is performed. The normal level of neutrophils in human blood differs with age and race. Infants have lower counts than older children and adults. African Americans have lower counts than Caucasians or Asians. The average adult level is 1500 cells/mm3 of blood. Neutrophil counts are interpreted as follows:  Greater than 1000 cells/mm3 gives normal protection against infection.  500 to 1000 cells/mm3 gives an increased risk for  infection.  200 to 500 cells/mm3 is a greater risk for severe infection.  Lower than 200 cells/mm3 is a marked risk of infection. This may require hospitalization and treatment with antibiotic medicines. TREATMENT  Treatment depends on the underlying cause, severity, and presence of infections or symptoms. It also depends on your health. Your caregiver will discuss the treatment plan with you. Mild cases are often easily treated and have a good outcome. Preventative measures may also be started to limit your risk of infections. Treatment can include:  Taking antibiotics.  Stopping medicines that are known to cause neutropenia.  Correcting nutritional deficiencies by eating green vegetables to supply folic acid and taking vitamin B supplements.  Stopping exposure to pesticides if your neutropenia is related to pesticide exposure.  Taking a blood growth factor called sargramostim, pegfilgrastim, or filgrastim if you are undergoing chemotherapy for cancer. This stimulates white blood cell production.  Removal of the spleen if you have Felty's syndrome and have repeated infections. HOME CARE INSTRUCTIONS   Follow your caregiver's instructions about when you need to have blood work done.  Wash your hands often. Make sure others who come in contact with you also wash their hands.  Wash raw fruits and vegetables before eating them. They can carry bacteria and fungi.  Avoid people with colds or spreadable (contagious) diseases (chickenpox, herpes zoster, influenza).  Avoid large crowds.  Avoid construction areas. The dust can release fungus into the air.  Be cautious around children in daycare or school environments.  Take care of your respiratory system by coughing and deep breathing.  Bathe daily.  Protect your skin from cuts and   burns.  Do not work in the garden or with flowers and plants.  Care for the mouth before and after meals by brushing with a soft toothbrush. If you have  mucositis, do not use mouthwash. Mouthwash contains alcohol and can dry out the mouth even more.  Clean the area between the genitals and the anus (perineal area) after urination and bowel movements. Women need to wipe from front to back.  Use a water soluble lubricant during sexual intercourse and practice good hygiene after. Do not have intercourse if you are severely neutropenic. Check with your caregiver for guidelines.  Exercise daily as tolerated.  Avoid people who were vaccinated with a live vaccine in the past 30 days. You should not receive live vaccines (polio, typhoid).  Do not provide direct care for pets. Avoid animal droppings. Do not clean litter boxes and bird cages.  Do not share food utensils.  Do not use tampons, enemas, or rectal suppositories unless directed by your caregiver.  Use an electric razor to remove hair.  Wash your hands after handling magazines, letters, and newspapers. SEEK IMMEDIATE MEDICAL CARE IF:   You have a fever.  You have chills or start to shake.  You feel nauseous or vomit.  You develop mouth sores.  You develop aches and pains.  You have redness and swelling around open wounds.  Your skin is warm to the touch.  You have pus coming from your wounds.  You develop swollen lymph nodes.  You feel weak or fatigued.  You develop red streaks on the skin. MAKE SURE YOU:  Understand these instructions.  Will watch your condition.  Will get help right away if you are not doing well or get worse. Document Released: 03/18/2002 Document Revised: 12/19/2011 Document Reviewed: 04/15/2011 [employer] Patient Information 2014 [employer].  
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
## 2013-12-27 NOTE — Progress Notes (Signed)
Pre visit review using our clinic review tool, if applicable. No additional management support is needed unless otherwise documented below in the visit note. 

## 2013-12-28 LAB — HIV ANTIBODY (ROUTINE TESTING W REFLEX): HIV: NONREACTIVE

## 2013-12-30 DIAGNOSIS — E871 Hypo-osmolality and hyponatremia: Secondary | ICD-10-CM | POA: Insufficient documentation

## 2013-12-30 DIAGNOSIS — D709 Neutropenia, unspecified: Secondary | ICD-10-CM | POA: Insufficient documentation

## 2013-12-30 DIAGNOSIS — M25561 Pain in right knee: Secondary | ICD-10-CM | POA: Insufficient documentation

## 2013-12-30 NOTE — Assessment & Plan Note (Signed)
Tolerating statin, encouraged heart healthy diet, avoid trans fats, minimize simple carbs and saturated fats. Increase exercise as tolerated 

## 2013-12-30 NOTE — Assessment & Plan Note (Signed)
HIV negative, will monitor

## 2013-12-30 NOTE — Assessment & Plan Note (Signed)
Well controlled, no changes to meds. Encouraged heart healthy diet such as the DASH diet and exercise as tolerated.  °

## 2013-12-30 NOTE — Assessment & Plan Note (Signed)
He is a runner and has been trying to continue to exercise but his knee pain will not resolve. Referred to sports med for further consideration

## 2013-12-30 NOTE — Assessment & Plan Note (Signed)
resolved 

## 2013-12-30 NOTE — Assessment & Plan Note (Signed)
hgba1c acceptable, minimize simple carbs. Increase exercise as tolerated. Continue current meds 

## 2014-01-03 ENCOUNTER — Other Ambulatory Visit (INDEPENDENT_AMBULATORY_CARE_PROVIDER_SITE_OTHER): Payer: Federal, State, Local not specified - PPO

## 2014-01-03 ENCOUNTER — Encounter: Payer: Self-pay | Admitting: Family Medicine

## 2014-01-03 ENCOUNTER — Ambulatory Visit (INDEPENDENT_AMBULATORY_CARE_PROVIDER_SITE_OTHER): Payer: Federal, State, Local not specified - PPO | Admitting: Family Medicine

## 2014-01-03 VITALS — BP 140/86 | HR 76 | Ht 68.0 in | Wt 185.0 lb

## 2014-01-03 DIAGNOSIS — M25569 Pain in unspecified knee: Secondary | ICD-10-CM

## 2014-01-03 DIAGNOSIS — M25561 Pain in right knee: Secondary | ICD-10-CM

## 2014-01-03 DIAGNOSIS — M7650 Patellar tendinitis, unspecified knee: Secondary | ICD-10-CM | POA: Insufficient documentation

## 2014-01-03 MED ORDER — MELOXICAM 15 MG PO TABS: 15.0000 mg | ORAL_TABLET | Freq: Every day | ORAL | Status: DC

## 2014-01-03 MED ORDER — NITROGLYCERIN 0.2 MG/HR TD PT24: MEDICATED_PATCH | TRANSDERMAL | Status: DC

## 2014-01-03 NOTE — Patient Instructions (Signed)
Good to meet you Try the strap a little lower on the knee.  Ice 20 minutes after activity and before bed Meloxicam daily for 10 days then as needed Nitroglycerin Protocol   Apply 1/4 nitroglycerin patch to affected area daily.  Change position of patch within the affected area every 24 hours.  You may experience a headache during the first 1-2 weeks of using the patch, these should subside.  If you experience headaches after beginning nitroglycerin patch treatment, you may take your preferred over the counter pain reliever.  Another side effect of the nitroglycerin patch is skin irritation or rash related to patch adhesive.  Please notify our office if you develop more severe headaches or rash, and stop the patch.  Tendon healing with nitroglycerin patch may require 12 to 24 weeks depending on the extent of injury.  Men should not use if taking Viagra, Cialis, or Levitra.   Do not use if you have migraines or rosacea.   Run only 1-2 times a week and then bike No plyos until I see you Come back again in 3 weeks.

## 2014-01-03 NOTE — Progress Notes (Signed)
[name] [address] [place], [place] [address] Phone: [phone] Subjective:    I'm seeing this patient by the request  of:  Danise Edge, MD   CC: Right knee pain.   [id number] Darren Salazar is a 43 y.o. male coming in with complaint of right knee pain. Patient states he has had this pain for 3-4 months. Patient does not remember any true injury. Patient states that the pain is mostly in the anterior aspect of the knee. Denies any clicking catching or locking on him. Patient is running about 15-20 miles a week and states with running having no pain but afterwards when he has some discomfort. Patient states if he sits for a long amount of time he does have significant pain and he has to sometimes pull over while driving and stretch to make it feel better. Patient has tried some over-the-counter medications with minimal benefit. Patient has been trying to do different types of straps with minimal benefit as well. Patient states he cavus comfortably at night. Patient states that the severity is approximately 6/10.     Past medical history, social, surgical and family history all reviewed in electronic medical record.   Review of Systems: No headache, visual changes, nausea, vomiting, diarrhea, constipation, dizziness, abdominal pain, skin rash, fevers, chills, night sweats, weight loss, swollen lymph nodes, body aches, joint swelling, muscle aches, chest pain, shortness of breath, mood changes.   Objective Blood pressure 140/86, pulse 76, height 5\' 8"  (1.727 m), weight 185 lb (83.915 kg), SpO2 97.00%.  General: No apparent distress alert and oriented x3 mood and affect normal, dressed appropriately.  HEENT: Pupils equal, extraocular movements intact  Respiratory: Patient's speak in full sentences and does not appear short of breath  Cardiovascular: No lower extremity edema, non tender, no erythema  Skin: Warm dry intact with no signs of infection or  rash on extremities or on axial skeleton.  Abdomen: Soft nontender  Neuro: Cranial nerves II through XII are intact, neurovascularly intact in all extremities with 2+ DTRs and 2+ pulses.  Lymph: No lymphadenopathy of posterior or anterior cervical chain or axillae bilaterally.  Gait normal with good balance and coordination.  MSK:  Non tender with full range of motion and good stability and symmetric strength and tone of shoulders, elbows, wrist, hip, and ankles bilaterally.  Knee: Normal to inspection with no erythema or effusion or obvious bony abnormalities. Patient does have mild increased callus formation over the inferior pole of the patella compared to the contralateral side this is tender to palpation. Palpation shows no medial or lateral joint line pain but once again only pain over the inferior pole of the patella the. ROM full in flexion and extension and lower leg rotation. Ligaments with solid consistent endpoints including ACL, PCL, LCL, MCL. Negative Mcmurray's, Apley's, and Thessalonian tests. Non painful patellar compression. Patellar glide without crepitus. Patellar and quadriceps tendons unremarkable. Hamstring and quadriceps strength is normal.  Contralaterally unremarkable  MSK US performed of: Right knee This study was ordered, performed, and interpreted by Terrilee Files D.O.  Knee: All structures visualized. Anteromedial, anterolateral, posteromedial, and posterolateral menisci unremarkable without tearing, fraying, effusion, or displacement. Patellar Tendon has no significant hypoechoic changes but does have increasing Doppler flow at the insertion at the inferior pole of the patella. Patient does have significant callus formation in this area as well that has increasing Doppler flow. No true cortical defect noted. No abnormality of prepatellar bursa. LCL and MCL  unremarkable on long and transverse views. No abnormality of origin of medial or lateral head of the  gastrocnemius.  IMPRESSION:  Patellar tendinitis, chronic with callus formation of the inferior pole of the patella.    Impression and Recommendations:     This case required medical decision making of moderate complexity.

## 2014-01-03 NOTE — Assessment & Plan Note (Signed)
Discussed diagnosis, treatment options. Patient does have a very demanding job so we did decide to do a little bit more aggressive. Patient of meloxicam daily for 10 days as well as a nitroglycerin patch. Patient was warned of potential side effects. In addition this patient was on icing regimen was given home exercise program. Patient will stop doing some plyometrics he states he's been doing as well as decreasing the amount of running. He'll come back in 3 weeks and we will slowly progress him back to all his regular activities.

## 2014-01-30 ENCOUNTER — Ambulatory Visit (INDEPENDENT_AMBULATORY_CARE_PROVIDER_SITE_OTHER): Payer: Federal, State, Local not specified - PPO | Admitting: Family Medicine

## 2014-01-30 ENCOUNTER — Encounter: Payer: Self-pay | Admitting: Family Medicine

## 2014-01-30 ENCOUNTER — Other Ambulatory Visit (INDEPENDENT_AMBULATORY_CARE_PROVIDER_SITE_OTHER): Payer: Federal, State, Local not specified - PPO

## 2014-01-30 VITALS — BP 142/84 | HR 78

## 2014-01-30 DIAGNOSIS — M25561 Pain in right knee: Secondary | ICD-10-CM

## 2014-01-30 DIAGNOSIS — M25569 Pain in unspecified knee: Secondary | ICD-10-CM

## 2014-01-30 DIAGNOSIS — M7650 Patellar tendinitis, unspecified knee: Secondary | ICD-10-CM

## 2014-01-30 NOTE — Patient Instructions (Signed)
Good to see you Ice still at end of activity always Try the voltaren gel 2 times daily for 10 days then as needed.  Continue exercises 3 times a week.  Running max 10 miles a week for next 2 weeks, then increase 1-3 miles a week if you want  Come back again in 4-6 weeks.

## 2014-01-30 NOTE — Assessment & Plan Note (Addendum)
Patient has been doing conservative therapy.  Discussed the importance of icing. Discussed topical anti-inflammatories and patient has some at home and he'll try. Patient will continue with the patellar strap with increasing activity and patient was given a running protocol that will progress slowly. Patient come back again in 4-6 weeks for further evaluation. As long as patient is doing well at the time we can release him.

## 2014-01-30 NOTE — Progress Notes (Signed)
  Darren Salazar D.O. [facility] [address] [place], [place] [address] Phone: [phone] Subjective:    CC: Right knee pain follow up  BJY:[id number]:Subjective Darren Salazar is a 43 y.o. male coming in with complaint of right knee pain. Patient was seen previously and was diagnosed with a patella tendinitis. Patient was given a patellar strap, ice and protocol, anti-inflammatories as well as nitroglycerin. Patient has been doing home exercise program and states he is doing approximately 60% better. Patient was unable to tolerate nitroglycerin. Patient has started running again and is noticing no significant discomfort afterwards. Patient is no longer doing any plyometrics. Patient denies any nighttime pain. Patient has not been icing a regular basis and has not been doing exercises quite as regularly as he should.    Past medical history, social, surgical and family history all reviewed in electronic medical record.   Review of Systems: No headache, visual changes, nausea, vomiting, diarrhea, constipation, dizziness, abdominal pain, skin rash, fevers, chills, night sweats, weight loss, swollen lymph nodes, body aches, joint swelling, muscle aches, chest pain, shortness of breath, mood changes.   Objective Blood pressure 142/84, pulse 78, SpO2 99.00%.  General: No apparent distress alert and oriented x3 mood and affect normal, dressed appropriately.  HEENT: Pupils equal, extraocular movements intact  Respiratory: Patient's speak in full sentences and does not appear short of breath  Cardiovascular: No lower extremity edema, non tender, no erythema  Skin: Warm dry intact with no signs of infection or rash on extremities or on axial skeleton.  Abdomen: Soft nontender  Neuro: Cranial nerves II through XII are intact, neurovascularly intact in all extremities with 2+ DTRs and 2+ pulses.  Lymph: No lymphadenopathy of posterior or anterior cervical chain or axillae bilaterally.  Gait normal  with good balance and coordination.  MSK:  Non tender with full range of motion and good stability and symmetric strength and tone of shoulders, elbows, wrist, hip, and ankles bilaterally.  Knee: Normal to inspection with no erythema or effusion or obvious bony abnormalities. Patient does have mild increased callus formation over the inferior pole of the patella compared to the contralateral side this is tender to palpation. Palpation shows no medial or lateral joint line pain and minimal pain over the inferior pole of the patella ROM full in flexion and extension and lower leg rotation. Ligaments with solid consistent endpoints including ACL, PCL, LCL, MCL. Negative Mcmurray's, Apley's, and Thessalonian tests. Non painful patellar compression. Patellar glide without crepitus. Patellar and quadriceps tendons unremarkable. Hamstring and quadriceps strength is normal.  Contralaterally unremarkable  MSK US performed of: Right knee This study was ordered, performed, and interpreted by Terrilee FilesZach Desera Graffeo D.O.  Knee: All structures visualized. Anteromedial, anterolateral, posteromedial, and posterolateral menisci unremarkable without tearing, fraying, effusion, or displacement. Patellar Tendon has no significant hypoechoic changes decreased Doppler flow from previous visit. Callus formation has decreased and is starting to become more tendon formation. No true cortical defect noted. Patient distally by about 3 cm does have an area of tendon sheath effusion noted No abnormality of prepatellar bursa. LCL and MCL unremarkable on long and transverse views. No abnormality of origin of medial or lateral head of the gastrocnemius.  IMPRESSION:  Patellar tendinitis, improving with distal tendon sheath effusion of the patellar tendon.    Impression and Recommendations:     This case required medical decision making of moderate complexity.

## 2014-02-10 ENCOUNTER — Other Ambulatory Visit: Payer: Self-pay | Admitting: Family Medicine

## 2014-03-13 ENCOUNTER — Encounter: Payer: Self-pay | Admitting: Family Medicine

## 2014-03-13 ENCOUNTER — Ambulatory Visit (INDEPENDENT_AMBULATORY_CARE_PROVIDER_SITE_OTHER): Payer: Federal, State, Local not specified - PPO | Admitting: Family Medicine

## 2014-03-13 VITALS — BP 136/80 | HR 78 | Ht 69.0 in | Wt 185.0 lb

## 2014-03-13 DIAGNOSIS — M7650 Patellar tendinitis, unspecified knee: Secondary | ICD-10-CM

## 2014-03-13 NOTE — Progress Notes (Signed)
[facility] [address] [place], [place] [address] Phone: [phone] Subjective:    CC: Right knee pain follow up  [id number] Darren Salazar is a 43 y.o. male coming in with complaint of right knee pain. Patient was seen previously and was diagnosed with a patella tendinitis. Patient was given a patellar strap, ice and protocol, anti-inflammatories as well as nitroglycerin. Hhe was also started on a running progression very slowly. Patient states he's been able to do all activities without any significant discomfort. Patient states that he has some mild dull aching sensation after activity. Patient has been able to work out on a regular basis. Denies any nighttime awakening. Denies any swelling or any new symptoms .    Past medical history, social, surgical and family history all reviewed in electronic medical record.   Review of Systems: No headache, visual changes, nausea, vomiting, diarrhea, constipation, dizziness, abdominal pain, skin rash, fevers, chills, night sweats, weight loss, swollen lymph nodes, body aches, joint swelling, muscle aches, chest pain, shortness of breath, mood changes.   Objective Blood pressure 136/80, pulse 78, height 5\' 9"  (1.753 m), weight 185 lb (83.915 kg), SpO2 98.00%.  General: No apparent distress alert and oriented x3 mood and affect normal, dressed appropriately.  HEENT: Pupils equal, extraocular movements intact  Respiratory: Patient's speak in full sentences and does not appear short of breath  Cardiovascular: No lower extremity edema, non tender, no erythema  Skin: Warm dry intact with no signs of infection or rash on extremities or on axial skeleton.  Abdomen: Soft nontender  Neuro: Cranial nerves II through XII are intact, neurovascularly intact in all extremities with 2+ DTRs and 2+ pulses.  Lymph: No lymphadenopathy of posterior or anterior cervical chain or axillae bilaterally.  Gait normal with good balance  and coordination.  MSK:  Non tender with full range of motion and good stability and symmetric strength and tone of shoulders, elbows, wrist, hip, and ankles bilaterally.  Knee: Normal to inspection with no erythema or effusion or obvious bony abnormalities. Patient does have mild increased callus formation over the inferior pole of the patella compared to the contralateral side this is tender to palpation. Palpation shows no medial or lateral joint line pain and minimal pain over the inferior pole of the patella ROM full in flexion and extension and lower leg rotation. Ligaments with solid consistent endpoints including ACL, PCL, LCL, MCL. Negative Mcmurray's, Apley's, and Thessalonian tests. Non painful patellar compression. Patellar glide without crepitus. Patellar and quadriceps tendons unremarkable. Hamstring and quadriceps strength is normal.  Contralaterally unremarkable  MSK US performed of: Right knee This study was ordered, performed, and interpreted by Terrilee Files D.O.  Knee: All structures visualized. Anteromedial, anterolateral, posteromedial, and posterolateral menisci unremarkable without tearing, fraying, effusion, or displacement. Patellar Tendon has no significant hypoechoic changes mild increase in Doppler flow and patient does have a very small in her substance tear of the deep bundle of the patella tendon Callus formation has decreased and is starting to become more tendon formation. No true cortical defect noted. Patient distally by about 3 cm does have an area of tendon sheath effusion noted No abnormality of prepatellar bursa. LCL and MCL unremarkable on long and transverse views. No abnormality of origin of medial or lateral head of the gastrocnemius.  IMPRESSION:  Patellar tendinitis, improving but still inferior tear appreciated.   Impression and Recommendations:     This case required medical decision making of moderate complexity.

## 2014-03-13 NOTE — Assessment & Plan Note (Addendum)
Patient is now 10 weeks out from diagnosis. Patient continues to improve. Patient is able to do all activities that he likes to do. Discussed patient continuing exercises and gave him more progression eccentric exercises for the knee for strengthening. As long as patient does well he can followup on an as-needed basis. In the interim he will continue the brace, icing, and home exercises.

## 2014-03-13 NOTE — Patient Instructions (Signed)
Great to see you.  Have thad work on eccentric for the quads and patella Ice still after activity Exercises 3 times a week You know where I am if you need me.

## 2014-03-21 ENCOUNTER — Other Ambulatory Visit: Payer: Self-pay | Admitting: Family Medicine

## 2014-03-31 DIAGNOSIS — M5010 Cervical disc disorder with radiculopathy, unspecified cervical region: Secondary | ICD-10-CM | POA: Insufficient documentation

## 2014-05-06 ENCOUNTER — Ambulatory Visit: Payer: Federal, State, Local not specified - PPO | Admitting: Family

## 2014-05-06 DIAGNOSIS — Z0289 Encounter for other administrative examinations: Secondary | ICD-10-CM

## 2014-05-07 ENCOUNTER — Encounter: Payer: Self-pay | Admitting: Family

## 2014-05-07 ENCOUNTER — Ambulatory Visit (INDEPENDENT_AMBULATORY_CARE_PROVIDER_SITE_OTHER): Payer: Federal, State, Local not specified - PPO | Admitting: Family

## 2014-05-07 ENCOUNTER — Ambulatory Visit: Payer: Federal, State, Local not specified - PPO | Admitting: Family Medicine

## 2014-05-07 VITALS — BP 124/88 | HR 96 | Temp 98.4°F | Resp 16 | Ht 67.5 in | Wt 180.0 lb

## 2014-05-07 DIAGNOSIS — M791 Myalgia, unspecified site: Secondary | ICD-10-CM

## 2014-05-07 DIAGNOSIS — R197 Diarrhea, unspecified: Secondary | ICD-10-CM | POA: Insufficient documentation

## 2014-05-07 DIAGNOSIS — IMO0001 Reserved for inherently not codable concepts without codable children: Secondary | ICD-10-CM

## 2014-05-07 LAB — CBC WITH DIFFERENTIAL/PLATELET
Basophils Absolute: 0 10*3/uL (ref 0.0–0.1)
Basophils Relative: 0 % (ref 0–1)
Eosinophils Absolute: 0.1 10*3/uL (ref 0.0–0.7)
Eosinophils Relative: 4 % (ref 0–5)
HEMATOCRIT: 44.9 % (ref 39.0–52.0)
HEMOGLOBIN: 15.8 g/dL (ref 13.0–17.0)
LYMPHS ABS: 1.2 10*3/uL (ref 0.7–4.0)
LYMPHS PCT: 35 % (ref 12–46)
MCH: 30.8 pg (ref 26.0–34.0)
MCHC: 35.2 g/dL (ref 30.0–36.0)
MCV: 87.5 fL (ref 78.0–100.0)
MONO ABS: 0.3 10*3/uL (ref 0.1–1.0)
Monocytes Relative: 10 % (ref 3–12)
NEUTROS ABS: 1.7 10*3/uL (ref 1.7–7.7)
Neutrophils Relative %: 51 % (ref 43–77)
Platelets: 222 10*3/uL (ref 150–400)
RBC: 5.13 MIL/uL (ref 4.22–5.81)
RDW: 12.9 % (ref 11.5–15.5)
WBC: 3.3 10*3/uL — AB (ref 4.0–10.5)

## 2014-05-07 LAB — BASIC METABOLIC PANEL
BUN: 18 mg/dL (ref 6–23)
CALCIUM: 9.4 mg/dL (ref 8.4–10.5)
CO2: 22 mEq/L (ref 19–32)
Chloride: 99 mEq/L (ref 96–112)
Creat: 0.78 mg/dL (ref 0.50–1.35)
GLUCOSE: 131 mg/dL — AB (ref 70–99)
Potassium: 4 mEq/L (ref 3.5–5.3)
SODIUM: 133 meq/L — AB (ref 135–145)

## 2014-05-07 LAB — CK: Total CK: 83 U/L (ref 7–232)

## 2014-05-07 NOTE — Assessment & Plan Note (Signed)
Symptoms could be secondary to viral illness, but given that they started 2 days after outdoor trip, need to also consider possibility of RMSF, Lyme disease, or parasist/GI infection (such as giardia). Will check cbc, bmet, CK level (due to myalgia on statin).  Advised pt as below:  Please complete blood work in lab. Complete stool samples and return to lab at your earliest convenience. Follow up with Dr. Abner GreenspanBlyth in 2 weeks. Call sooner if symptoms worsen, or if you develop fever, rash.
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
## 2014-05-07 NOTE — Progress Notes (Signed)
Pre visit review using our clinic review tool, if applicable. No additional management support is needed unless otherwise documented below in the visit note. 

## 2014-05-07 NOTE — Patient Instructions (Signed)
Please complete blood work in lab. Complete stool samples and return to lab at your earliest convenience. Follow up with Dr. Abner GreenspanBlyth in 2 weeks. Call sooner if symptoms worsen, or if you develop fever, rash.

## 2014-05-08 LAB — ROCKY MTN SPOTTED FVR ABS PNL(IGG+IGM)
RMSF IGG: 0.11 IV
RMSF IgM: 0.12 IV

## 2014-05-09 LAB — LYME DISEASE DNA BY PCR(BORRELIA BURG): B burgdorferi DNA: NOT DETECTED

## 2014-05-09 LAB — OVA AND PARASITE EXAMINATION: OP: NONE SEEN

## 2014-05-10 LAB — CLOSTRIDIUM DIFFICILE BY PCR: CDIFFPCR: NOT DETECTED

## 2014-05-11 ENCOUNTER — Encounter: Payer: Self-pay | Admitting: Family

## 2014-05-12 ENCOUNTER — Encounter: Payer: Self-pay | Admitting: Family

## 2014-05-12 LAB — STOOL CULTURE

## 2014-05-13 ENCOUNTER — Telehealth: Payer: Self-pay | Admitting: Family

## 2014-05-13 NOTE — Telephone Encounter (Signed)
See this and last mychart message. Please relay info to pt.  How is he feeling? thanks

## 2014-05-13 NOTE — Telephone Encounter (Signed)
Notified pt. He states that symptoms are improved.

## 2014-05-13 NOTE — Telephone Encounter (Signed)
Appointment cancelled

## 2014-05-13 NOTE — Telephone Encounter (Signed)
pls cancel pt's 2 week follow up apt.

## 2014-05-15 ENCOUNTER — Telehealth: Payer: Self-pay | Admitting: Family

## 2014-05-15 NOTE — Telephone Encounter (Signed)
Please contact pt and let him know we will cancel his appointment since he is feeling better.

## 2014-05-16 NOTE — Telephone Encounter (Signed)
Informed patient of this.  °

## 2014-05-21 ENCOUNTER — Ambulatory Visit: Payer: Federal, State, Local not specified - PPO | Admitting: Family

## 2014-05-25 ENCOUNTER — Encounter (HOSPITAL_BASED_OUTPATIENT_CLINIC_OR_DEPARTMENT_OTHER): Payer: Self-pay | Admitting: Emergency Medicine

## 2014-05-25 ENCOUNTER — Emergency Department (HOSPITAL_BASED_OUTPATIENT_CLINIC_OR_DEPARTMENT_OTHER): Payer: Federal, State, Local not specified - PPO

## 2014-05-25 ENCOUNTER — Emergency Department (HOSPITAL_BASED_OUTPATIENT_CLINIC_OR_DEPARTMENT_OTHER)
Admission: EM | Admit: 2014-05-25 | Discharge: 2014-05-25 | Disposition: A | Discharge status: Home / Self Care | Payer: Federal, State, Local not specified - PPO | Attending: Emergency Medicine | Admitting: Emergency Medicine

## 2014-05-25 DIAGNOSIS — I10 Essential (primary) hypertension: Secondary | ICD-10-CM | POA: Insufficient documentation

## 2014-05-25 DIAGNOSIS — Z79899 Other long term (current) drug therapy: Secondary | ICD-10-CM | POA: Insufficient documentation

## 2014-05-25 DIAGNOSIS — R0789 Other chest pain: Secondary | ICD-10-CM | POA: Insufficient documentation

## 2014-05-25 DIAGNOSIS — E119 Type 2 diabetes mellitus without complications: Secondary | ICD-10-CM | POA: Insufficient documentation

## 2014-05-25 DIAGNOSIS — R079 Chest pain, unspecified: Secondary | ICD-10-CM | POA: Insufficient documentation

## 2014-05-25 LAB — CBC WITH DIFFERENTIAL/PLATELET
BASOS ABS: 0 10*3/uL (ref 0.0–0.1)
Basophils Relative: 0 % (ref 0–1)
Eosinophils Absolute: 0.2 10*3/uL (ref 0.0–0.7)
Eosinophils Relative: 3 % (ref 0–5)
HCT: 46.5 % (ref 39.0–52.0)
Hemoglobin: 16.3 g/dL (ref 13.0–17.0)
LYMPHS ABS: 1.8 10*3/uL (ref 0.7–4.0)
Lymphocytes Relative: 42 % (ref 12–46)
MCH: 31.3 pg (ref 26.0–34.0)
MCHC: 35.1 g/dL (ref 30.0–36.0)
MCV: 89.3 fL (ref 78.0–100.0)
Monocytes Absolute: 0.7 10*3/uL (ref 0.1–1.0)
Monocytes Relative: 16 % — ABNORMAL HIGH (ref 3–12)
NEUTROS ABS: 1.7 10*3/uL (ref 1.7–7.7)
Neutrophils Relative %: 39 % — ABNORMAL LOW (ref 43–77)
Platelets: 178 10*3/uL (ref 150–400)
RBC: 5.21 MIL/uL (ref 4.22–5.81)
RDW: 12.4 % (ref 11.5–15.5)
WBC: 4.4 10*3/uL (ref 4.0–10.5)

## 2014-05-25 LAB — BASIC METABOLIC PANEL
ANION GAP: 14 (ref 5–15)
BUN: 20 mg/dL (ref 6–23)
CALCIUM: 10.1 mg/dL (ref 8.4–10.5)
CO2: 25 mEq/L (ref 19–32)
Chloride: 98 mEq/L (ref 96–112)
Creatinine, Ser: 0.9 mg/dL (ref 0.50–1.35)
GFR calc Af Amer: >90 mL/min (ref >90)
GFR calc non Af Amer: >90 mL/min (ref >90)
Glucose, Bld: 150 mg/dL — ABNORMAL HIGH (ref 70–99)
POTASSIUM: 4 meq/L (ref 3.7–5.3)
Sodium: 137 mEq/L (ref 137–147)

## 2014-05-25 LAB — TROPONIN I: Troponin I: <0.3 ng/mL (ref <0.30)

## 2014-05-25 LAB — D-DIMER, QUANTITATIVE: D-Dimer, Quant: 0.29 ug/mL-FEU (ref 0.00–0.48)

## 2014-05-25 NOTE — Discharge Instructions (Signed)
Return to the ED with any concerns including difficulty breathing, worsening chest pain, leg swelling, fainting, vomiting, decreased level of alertness/lethargy, or any other alarming symptoms

## 2014-05-25 NOTE — ED Notes (Signed)
Pt reports went to sleep last night with minor left chest pain awoke at 0530 and pain continued.
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
## 2014-07-08 ENCOUNTER — Ambulatory Visit (INDEPENDENT_AMBULATORY_CARE_PROVIDER_SITE_OTHER): Payer: Federal, State, Local not specified - PPO | Admitting: Family Medicine

## 2014-07-08 ENCOUNTER — Encounter: Payer: Self-pay | Admitting: Family Medicine

## 2014-07-08 VITALS — BP 125/90 | HR 103 | Temp 98.6°F | Ht 69.0 in | Wt 182.4 lb

## 2014-07-08 DIAGNOSIS — R7989 Other specified abnormal findings of blood chemistry: Secondary | ICD-10-CM

## 2014-07-08 DIAGNOSIS — I10 Essential (primary) hypertension: Secondary | ICD-10-CM

## 2014-07-08 DIAGNOSIS — R7309 Other abnormal glucose: Secondary | ICD-10-CM

## 2014-07-08 DIAGNOSIS — E785 Hyperlipidemia, unspecified: Secondary | ICD-10-CM

## 2014-07-08 DIAGNOSIS — R946 Abnormal results of thyroid function studies: Secondary | ICD-10-CM

## 2014-07-08 DIAGNOSIS — R000 Tachycardia, unspecified: Secondary | ICD-10-CM

## 2014-07-08 DIAGNOSIS — D709 Neutropenia, unspecified: Secondary | ICD-10-CM

## 2014-07-08 DIAGNOSIS — R739 Hyperglycemia, unspecified: Secondary | ICD-10-CM

## 2014-07-08 DIAGNOSIS — R0789 Other chest pain: Secondary | ICD-10-CM

## 2014-07-08 LAB — HEPATIC FUNCTION PANEL
ALBUMIN: 4.4 g/dL (ref 3.5–5.2)
ALK PHOS: 49 U/L (ref 39–117)
ALT: 33 U/L (ref 0–53)
AST: 30 U/L (ref 0–37)
Bilirubin, Direct: 0 mg/dL (ref 0.0–0.3)
TOTAL PROTEIN: 8 g/dL (ref 6.0–8.3)
Total Bilirubin: 0.8 mg/dL (ref 0.2–1.2)

## 2014-07-08 LAB — RENAL FUNCTION PANEL
ALBUMIN: 4.4 g/dL (ref 3.5–5.2)
BUN: 15 mg/dL (ref 6–23)
CHLORIDE: 99 meq/L (ref 96–112)
CO2: 26 mEq/L (ref 19–32)
CREATININE: 1 mg/dL (ref 0.4–1.5)
Calcium: 9.8 mg/dL (ref 8.4–10.5)
GFR: 108.19 mL/min (ref >60.00)
Glucose, Bld: 123 mg/dL — ABNORMAL HIGH (ref 70–99)
PHOSPHORUS: 2.9 mg/dL (ref 2.3–4.6)
Potassium: 4.4 mEq/L (ref 3.5–5.1)
Sodium: 133 mEq/L — ABNORMAL LOW (ref 135–145)

## 2014-07-08 LAB — LIPID PANEL
CHOLESTEROL: 157 mg/dL (ref 0–200)
HDL: 52.1 mg/dL (ref >39.00)
LDL CALC: 95 mg/dL (ref 0–99)
NonHDL: 104.9
Total CHOL/HDL Ratio: 3
Triglycerides: 49 mg/dL (ref 0.0–149.0)
VLDL: 9.8 mg/dL (ref 0.0–40.0)

## 2014-07-08 LAB — CBC
HEMATOCRIT: 46.6 % (ref 39.0–52.0)
HEMOGLOBIN: 15.8 g/dL (ref 13.0–17.0)
MCHC: 33.9 g/dL (ref 30.0–36.0)
MCV: 91.7 fl (ref 78.0–100.0)
Platelets: 185 10*3/uL (ref 150.0–400.0)
RBC: 5.08 Mil/uL (ref 4.22–5.81)
RDW: 12.7 % (ref 11.5–15.5)
WBC: 3.6 10*3/uL — ABNORMAL LOW (ref 4.0–10.5)

## 2014-07-08 LAB — TSH: TSH: 0.18 u[IU]/mL — AB (ref 0.35–4.50)

## 2014-07-08 LAB — HEMOGLOBIN A1C: Hgb A1c MFr Bld: 6.5 % (ref 4.6–6.5)

## 2014-07-08 NOTE — Progress Notes (Signed)
Pre visit review using our clinic review tool, if applicable. No additional management support is needed unless otherwise documented below in the visit note. 

## 2014-07-09 ENCOUNTER — Encounter: Payer: Self-pay | Admitting: Family Medicine

## 2014-07-09 ENCOUNTER — Other Ambulatory Visit (INDEPENDENT_AMBULATORY_CARE_PROVIDER_SITE_OTHER): Payer: Federal, State, Local not specified - PPO

## 2014-07-09 DIAGNOSIS — R946 Abnormal results of thyroid function studies: Secondary | ICD-10-CM

## 2014-07-09 DIAGNOSIS — R7989 Other specified abnormal findings of blood chemistry: Secondary | ICD-10-CM

## 2014-07-09 DIAGNOSIS — R0789 Other chest pain: Secondary | ICD-10-CM | POA: Insufficient documentation

## 2014-07-09 HISTORY — DX: Other specified abnormal findings of blood chemistry: R79.89

## 2014-07-09 LAB — T4, FREE: FREE T4: 1.08 ng/dL (ref 0.60–1.60)

## 2014-07-09 NOTE — Assessment & Plan Note (Addendum)
Mild, stable.  

## 2014-07-09 NOTE — Assessment & Plan Note (Signed)
tsh suppressed but free T4 is normal

## 2014-07-09 NOTE — Assessment & Plan Note (Signed)
Well controlled, no changes to meds. Encouraged heart healthy diet such as the DASH diet and exercise as tolerated.  °

## 2014-07-09 NOTE — Assessment & Plan Note (Signed)
hgba1c acceptable, minimize simple carbs. Increase exercise as tolerated.  

## 2014-07-09 NOTE — Assessment & Plan Note (Signed)
Encouraged heart healthy diet, increase exercise, avoid trans fats, consider a krill oil cap daily 
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
## 2014-07-09 NOTE — Assessment & Plan Note (Signed)
With some tachycardia and risk factors will refer to cardiology.

## 2014-07-28 ENCOUNTER — Encounter: Payer: Self-pay | Admitting: Family Medicine

## 2014-07-28 ENCOUNTER — Ambulatory Visit (INDEPENDENT_AMBULATORY_CARE_PROVIDER_SITE_OTHER): Payer: Federal, State, Local not specified - PPO | Admitting: Family Medicine

## 2014-07-28 ENCOUNTER — Other Ambulatory Visit (INDEPENDENT_AMBULATORY_CARE_PROVIDER_SITE_OTHER): Payer: Federal, State, Local not specified - PPO

## 2014-07-28 ENCOUNTER — Ambulatory Visit (INDEPENDENT_AMBULATORY_CARE_PROVIDER_SITE_OTHER)
Admission: RE | Admit: 2014-07-28 | Discharge: 2014-07-28 | Disposition: A | Discharge status: Home / Self Care | Payer: Federal, State, Local not specified - PPO | Source: Ambulatory Visit | Attending: Family Medicine | Admitting: Family Medicine

## 2014-07-28 VITALS — BP 142/84 | HR 75 | Ht 68.0 in | Wt 187.0 lb

## 2014-07-28 DIAGNOSIS — M79601 Pain in right arm: Secondary | ICD-10-CM

## 2014-07-28 DIAGNOSIS — M9901 Segmental and somatic dysfunction of cervical region: Secondary | ICD-10-CM

## 2014-07-28 DIAGNOSIS — M9902 Segmental and somatic dysfunction of thoracic region: Secondary | ICD-10-CM

## 2014-07-28 DIAGNOSIS — M9908 Segmental and somatic dysfunction of rib cage: Secondary | ICD-10-CM

## 2014-07-28 DIAGNOSIS — M999 Biomechanical lesion, unspecified: Secondary | ICD-10-CM

## 2014-07-28 DIAGNOSIS — M5090 Cervical disc disorder, unspecified, unspecified cervical region: Secondary | ICD-10-CM

## 2014-07-28 DIAGNOSIS — M7521 Bicipital tendinitis, right shoulder: Secondary | ICD-10-CM | POA: Insufficient documentation

## 2014-07-28 MED ORDER — GABAPENTIN 300 MG PO CAPS: ORAL_CAPSULE | ORAL | Status: DC

## 2014-07-28 NOTE — Assessment & Plan Note (Signed)
Patient does have more of a brachial radialis muscle injury. We are going to get x-rays to evaluate further for any sign of a radial head subluxation or occult fracture but I think this is unlikely. Patient was put in a wrist brace at this time and will do home exercise program. We discussed an icing protocol as well as topical anti-inflammatories. Patient will try these interventions and then come back again in 3 weeks for further evaluation.

## 2014-07-28 NOTE — Patient Instructions (Addendum)
Good to see you Ice is better then heat.  Gabapentin 300mg  at night.  ON wall head, shoulders butt and heels touching for goal of 5 minutes daily.  Continue the neck exercises.  Wear wrist brace day and night for 2 weeks Xrays downstairs today.  Exercises 3 times a week for elbow pain See me again in 2-3 weeks.

## 2014-07-28 NOTE — Progress Notes (Signed)
Darren Salazar D.O. [facility] [address] [place], [place] [address] Phone: [phone] Subjective:     CC: Right arm pain  BJY:[id number]:Subjective Markus DaftLarry T Salazar is a 43 y.o. male coming in with complaint of right arm pain after weightlifting. Patient has been seen previously for other problems. Patient states he has 2 problems.   Patient states that he started having right arm pain after weightlifting. Patient states it seems to be in the anterior aspect of his arm. Can radiate down towards his elbow. States that if he lifts anything heavy it is very uncomfortable. Denies any weakness or any numbness. Patient rates the pain is approximately 6/10. Patient denies any nighttime awakening. No pain with regular daily activities. Does not remember any specific event but states that when he is starting to increase his activity it started to hurt him more.  Patient also has some cervical neck pain. Since 2013 patient is known that he does have cervical radiculopathy with impingement on the left side. Patient started having worsening pain in the neck and then went for an epidural steroid injection recently and it did not get any significant improvement. Patient is concerned because this pain seems to be drastic in keeping him from doing his regular activities. Can wake him up at night. Patient would like to avoid any surgical intervention. Does radiate down his left arm towards the shoulder area. Denies any weakness. Findings at difficult to get comfortable at night and is waking him up from time to time. Patient with the severity of 7/10.       Past medical history, social, surgical and family history all reviewed in electronic medical record.   Review of Systems: No headache, visual changes, nausea, vomiting, diarrhea, constipation, dizziness, abdominal pain, skin rash, fevers, chills, night sweats, weight loss, swollen lymph nodes, body aches, joint swelling, muscle aches, chest pain, shortness  of breath, mood changes.   Objective Blood pressure 142/84, pulse 75, height 5\' 8"  (1.727 m), weight 187 lb (84.823 kg), SpO2 98.00%.  General: No apparent distress alert and oriented x3 mood and affect normal, dressed appropriately.  HEENT: Pupils equal, extraocular movements intact  Respiratory: Patient's speak in full sentences and does not appear short of breath  Cardiovascular: No lower extremity edema, non tender, no erythema  Skin: Warm dry intact with no signs of infection or rash on extremities or on axial skeleton.  Abdomen: Soft nontender  Neuro: Cranial nerves II through XII are intact, neurovascularly intact in all extremities with 2+ DTRs and 2+ pulses.  Lymph: No lymphadenopathy of posterior or anterior cervical chain or axillae bilaterally.  Gait normal with good balance and coordination.  MSK:  Non tender with full range of motion and good stability and symmetric strength and tone of shoulders,  wrist, hip, knee and ankles bilaterally.   Elbow: Right Unremarkable to inspection. Range of motion full pronation, supination, flexion, extension. Strength is full to all of the above directions Stable to varus, valgus stress. Negative moving valgus stress test. Tender over the radial head on the anterior aspect of the elbow to. Ulnar nerve does not sublux. Negative cubital tunnel Tinel's. Contralateral elbow unremarkable  Neck: Inspection unremarkable. No palpable stepoffs. Negative Spurling's maneuver today.Darren Salazar. Lax last 5 of rotation and side bending. Grip strength and sensation normal in bilateral hands Strength good C4 to T1 distribution No sensory change to C4 to T1 Negative Hoffman sign bilaterally Reflexes normal  OMT Physical Exam  Cervical  C2 F RS right  C4 F RS left C7 F RS right  Thoracic T1 E RS left with elevated first rib T3 E RS right T5 E RS left.     Musculoskeletal ultrasound was performed and interpreted by Terrilee FilesZach Salazar D.O.   Elbow:    Lateral epicondyle and common extensor tendon origin visualized.  No edema, effusions, or avulsions seen.  Radial head unremarkable and located in annular ligament Medial epicondyle and common flexor tendon origin visualized.  No edema, effusions, or avulsions seen. Ulnar nerve in cubital tunnel unremarkable. Olecranon and triceps insertion visualized and unremarkable without edema, effusion, or avulsion.  No signs olecranon bursitis. It appears the patient does have scarring at the insertion of the brachial radialis on the tuberosity. There is no significant tear noted. Patient does have increasing Doppler flow in this area.  IMPRESSION: Brachial radialis tendinitis     Impression and Recommendations:     This case required medical decision making of moderate complexity.

## 2014-07-28 NOTE — Assessment & Plan Note (Addendum)
We attempted osteopathic manipulation today with moderate improvement. We discussed the patient may continue to have some radicular symptoms from time to time. We discussed icing protocol as well as a home exercise program and given a handout.  Patient did respond somewhat to the manipulation and this hopefully will be helpful. We tucked about postural change in mental be good. Medications per orders including the gabapentin at 300 mg nightly. Patient will continue with the home exercises and was given a handout different range of motion and strengthening exercises that I think will be beneficial. Patient did not respond to the epidural steroid injection which makes me think that this could be more secondary to postural changes that may need to be necessary. Patient will come back in 23 weeks for further evaluation and treatment. Spent greater than 25 minutes with patient face-to-face and had greater than 50% of counseling including as described above in assessment and plan.

## 2014-07-28 NOTE — Assessment & Plan Note (Signed)
Decision today to treat with OMT was based on Physical Exam  After verbal consent patient was treated with ME, FPR techniques in cervical, rib, thoracic areas  Patient tolerated the procedure well with improvement in symptoms  Patient given exercises, stretches and lifestyle modifications  See medications in patient instructions if given  Patient will follow up in 2-3 weeks

## 2014-08-08 ENCOUNTER — Other Ambulatory Visit: Payer: Self-pay | Admitting: Family Medicine

## 2014-08-12 ENCOUNTER — Ambulatory Visit: Payer: Federal, State, Local not specified - PPO | Admitting: Family Medicine

## 2014-08-15 ENCOUNTER — Ambulatory Visit (INDEPENDENT_AMBULATORY_CARE_PROVIDER_SITE_OTHER): Payer: Federal, State, Local not specified - PPO | Admitting: Family Medicine

## 2014-08-15 ENCOUNTER — Encounter: Payer: Self-pay | Admitting: Cardiology

## 2014-08-15 ENCOUNTER — Ambulatory Visit (INDEPENDENT_AMBULATORY_CARE_PROVIDER_SITE_OTHER): Payer: Federal, State, Local not specified - PPO | Admitting: Cardiology

## 2014-08-15 ENCOUNTER — Encounter: Payer: Self-pay | Admitting: Family Medicine

## 2014-08-15 VITALS — BP 120/86 | HR 77 | Ht 68.0 in | Wt 183.0 lb

## 2014-08-15 VITALS — BP 130/80 | HR 78 | Ht 68.0 in | Wt 182.0 lb

## 2014-08-15 DIAGNOSIS — R079 Chest pain, unspecified: Secondary | ICD-10-CM | POA: Insufficient documentation

## 2014-08-15 DIAGNOSIS — M999 Biomechanical lesion, unspecified: Secondary | ICD-10-CM

## 2014-08-15 DIAGNOSIS — M9901 Segmental and somatic dysfunction of cervical region: Secondary | ICD-10-CM

## 2014-08-15 DIAGNOSIS — M7521 Bicipital tendinitis, right shoulder: Secondary | ICD-10-CM

## 2014-08-15 DIAGNOSIS — R072 Precordial pain: Secondary | ICD-10-CM

## 2014-08-15 DIAGNOSIS — M9902 Segmental and somatic dysfunction of thoracic region: Secondary | ICD-10-CM

## 2014-08-15 DIAGNOSIS — M9908 Segmental and somatic dysfunction of rib cage: Secondary | ICD-10-CM

## 2014-08-15 DIAGNOSIS — R0789 Other chest pain: Secondary | ICD-10-CM

## 2014-08-15 DIAGNOSIS — M5090 Cervical disc disorder, unspecified, unspecified cervical region: Secondary | ICD-10-CM

## 2014-08-15 DIAGNOSIS — I517 Cardiomegaly: Secondary | ICD-10-CM

## 2014-08-15 NOTE — Progress Notes (Signed)
Tawana ScaleZach Kylin Dubs D.O. [facility] [address] [place], [place] [address] Phone: [phone] Subjective:     CC: Right arm pain  BJY:NWGNFAOZHYHPI:Subjective Markus DaftLarry T Taira is a 43 y.o. male coming in with complaint of right arm pain after weightlifting. Patient has been seen previously for other problems. Patient states he has 2 problems.   Patient states that he started having right arm pain, patient was found to have a brachial radialis tendinitis. Patient states that now he is feeling significantly better. Patient has been wearing the wrist brace. Patient states that he is partially 90% better. Denies any numbness or tingling.   Patient also has some cervical neck pain. Since 2013 patient is known that he does have cervical radiculopathy with impingement on the left side. Patient started having worsening pain in the neck and then went for an epidural steroid injection recently and it did not get any significant improvement. Patient was seen by me and we did discuss the possibility of osteopathic manipulation and patient did have a trial at last exam. Patient states that his neck is been feeling better than it has in years. Patient has been able to do more activity. In some mild discomfort but no radicular symptoms. He is sleeping comfortably at night. Patient is very happy with the results..       Past medical history, social, surgical and family history all reviewed in electronic medical record.   Review of Systems: No headache, visual changes, nausea, vomiting, diarrhea, constipation, dizziness, abdominal pain, skin rash, fevers, chills, night sweats, weight loss, swollen lymph nodes, body aches, joint swelling, muscle aches, chest pain, shortness of breath, mood changes.   Objective Blood pressure 130/80, pulse 78, height 5\' 8"  (1.727 m), weight 182 lb (82.555 kg).  General: No apparent distress alert and oriented x3 mood and affect normal, dressed appropriately.  HEENT: Pupils equal,  extraocular movements intact  Respiratory: Patient's speak in full sentences and does not appear short of breath  Cardiovascular: No lower extremity edema, non tender, no erythema  Skin: Warm dry intact with no signs of infection or rash on extremities or on axial skeleton.  Abdomen: Soft nontender  Neuro: Cranial nerves II through XII are intact, neurovascularly intact in all extremities with 2+ DTRs and 2+ pulses.  Lymph: No lymphadenopathy of posterior or anterior cervical chain or axillae bilaterally.  Gait normal with good balance and coordination.  MSK:  Non tender with full range of motion and good stability and symmetric strength and tone of shoulders,  wrist, hip, knee and ankles bilaterally.   Elbow: Right Unremarkable to inspection. Range of motion full pronation, supination, flexion, extension. Strength is full to all of the above directions Stable to varus, valgus stress. Negative moving valgus stress test. Nontender on exam Ulnar nerve does not sublux. Negative cubital tunnel Tinel's. Contralateral elbow unremarkable  Neck: Inspection unremarkable. No palpable stepoffs. Negative Spurling's maneuver today.. Near full range of motion Grip strength and sensation normal in bilateral hands Strength good C4 to T1 distribution No sensory change to C4 to T1 Negative Hoffman sign bilaterally Reflexes normal  OMT Physical Exam  Cervical  C2 F RS right  C7 F RS right  Thoracic T1 E RS left with elevated first rib  T5 E RS left.     Musculoskeletal ultrasound was performed and interpreted by Terrilee FilesZach Prabhleen Montemayor D.O.   Elbow:  Lateral epicondyle and common extensor tendon origin visualized.  No edema, effusions, or avulsions seen.  Radial head unremarkable  and located in annular ligament Medial epicondyle and common flexor tendon origin visualized.  No edema, effusions, or avulsions seen. Ulnar nerve in cubital tunnel unremarkable. Olecranon and triceps insertion visualized  and unremarkable without edema, effusion, or avulsion.  No signs olecranon bursitis. It appears the patient does have scarring at the insertion of the brachial radialis on the tuberosity. There is no significant tear noted. Patient does have increasing Doppler flow in this area.  IMPRESSION: Brachial radialis tendinitis significantly improved    Impression and Recommendations:     This case required medical decision making of moderate complexity.

## 2014-08-15 NOTE — Assessment & Plan Note (Signed)
Decision today to treat with OMT was based on Physical Exam  After verbal consent patient was treated with ME, FPR techniques in cervical, rib, thoracic areas  Patient tolerated the procedure well with improvement in symptoms  Patient given exercises, stretches and lifestyle modifications  See medications in patient instructions if given  Patient will follow up in  weeks
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
## 2014-08-15 NOTE — Patient Instructions (Signed)
Good to see you You are doing great.  After the weekend you probably don't need the brace Would continue the exercises 2-3 times a week for another couple weeks You neck is doing great.  On wall heels, butt shoulder and head for goal of 5 minutes.  See me again in 6 weeks.

## 2014-08-15 NOTE — Assessment & Plan Note (Signed)
Patient is 90% healed at this time on ultrasound. We discussed decreasing the brace at this time and increasing his activity slowly over the course of the next 2 weeks. Patient was given phase II exercises for more strengthening. Patient and will follow-up with me again in 6 weeks for further evaluation.

## 2014-08-15 NOTE — Assessment & Plan Note (Signed)
Patient did respond extremity well to osteopathic manipulation. Patient is doing such a significant good amount of job that we will continue with osteopathic manipulation. Patient will come back in 6 week intervals. Patient was given new postural exercises and showed proper technique. Patient will continue the over-the-counter medications that I think is helping him as well. Patient will follow-up with me again in 6 weeks.

## 2014-08-15 NOTE — Patient Instructions (Signed)
Your physician recommends that you continue on your current medications as directed. Please refer to the Current Medication list given to you today.   Your physician has requested that you have an echocardiogram. Echocardiography is a painless test that uses sound waves to create images of your heart. It provides your doctor with information about the size and shape of your heart and how well your heart's chambers and valves are working. This procedure takes approximately one hour. There are no restrictions for this procedure.   Your physician has requested that you have an exercise tolerance test. For further information please visit [email or web address]. Please also follow instruction sheet, as given.   Your physician recommends that you schedule a follow-up appointment in: AS NEEDED WITH DR Delton SeeNELSON

## 2014-09-09 ENCOUNTER — Ambulatory Visit (INDEPENDENT_AMBULATORY_CARE_PROVIDER_SITE_OTHER): Payer: Federal, State, Local not specified - PPO | Admitting: Physician Assistant

## 2014-09-09 ENCOUNTER — Encounter: Payer: Self-pay | Admitting: Physician Assistant

## 2014-09-09 VITALS — BP 139/92 | HR 93 | Temp 98.8°F | Resp 16 | Ht 68.0 in | Wt 182.5 lb

## 2014-09-09 DIAGNOSIS — R350 Frequency of micturition: Secondary | ICD-10-CM

## 2014-09-09 DIAGNOSIS — R102 Pelvic and perineal pain: Secondary | ICD-10-CM

## 2014-09-09 LAB — POCT URINALYSIS DIPSTICK
BILIRUBIN UA: NEGATIVE
Blood, UA: NEGATIVE
Glucose, UA: NEGATIVE
LEUKOCYTES UA: NEGATIVE
NITRITE UA: NEGATIVE
PROTEIN UA: NEGATIVE
Spec Grav, UA: >=1.03
Urobilinogen, UA: 0.2
pH, UA: 5.5

## 2014-09-09 LAB — HM DIABETES EYE EXAM

## 2014-09-09 MED ORDER — LEVOFLOXACIN 500 MG PO TABS: 500.0000 mg | ORAL_TABLET | Freq: Every day | ORAL | Status: DC

## 2014-09-09 MED ORDER — MELOXICAM 15 MG PO TABS: 15.0000 mg | ORAL_TABLET | Freq: Every day | ORAL | Status: DC

## 2014-09-09 NOTE — Patient Instructions (Signed)
Please take Meloxicam daily as directed with food over the next couple of weeks.  Avoid bike riding until symptoms resolve.  I am sending in an antibiotic Levaquin for you to take the next 3 days while I am waiting on your culture results.  If positive for infection, we will extend the course of antibiotic.  If negative, we will stop antibiotics.  Follow-up in 2 weeks if symptoms have not resolved.

## 2014-09-09 NOTE — Progress Notes (Signed)
Patient presents to clinic today c/o pelvic and inguinal pain and soreness s/p bicycle ride last week.  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 Denies fall, trauma or injury during ride.  Endorses gradual onset of symptoms.  Denies hx of prostatitis.  Denies tenesmus or rectal pain.  Denies penile or testicular pain.  Endorses some urinary urgency and frequency since event.  Denies hematuria.  Pain does radiate from groin to lower back.  Denies saddle anesthesia. Patient has not taken anything for symptoms.  Past Medical History  Diagnosis Date  . Diabetes mellitus type II   . Hypertension   . History of cardiovascular stress test 2009    normal  . Headache 04/22/2013  . Preventative health care 07/06/2013  . Abnormal thyroid blood test 07/09/2014    Current Outpatient Prescriptions on File Prior to Visit  Medication Sig Dispense Refill  . atorvastatin (LIPITOR) 20 MG tablet TAKE ONE TABLET BY MOUTH ONE TIME DAILY  90 tablet 1  . EPINEPHrine (EPIPEN) 0.3 mg/0.3 mL DEVI Inject 0.3 mLs (0.3 mg total) into the muscle once. 4 Device 2  . gabapentin (NEURONTIN) 300 MG capsule 1 nightly (Patient taking differently: Take 300 mg by mouth as needed (pain). 1 nightly) 30 capsule 3  . lisinopril-hydrochlorothiazide (PRINZIDE,ZESTORETIC) 20-12.5 MG per tablet TAKE ONE TABLET BY MOUTH ONE TIME DAILY  90 tablet 1  . metFORMIN (GLUCOPHAGE-XR) 500 MG 24 hr tablet TAKE ONE TABLET BY MOUTH TWICE DAILY  180 tablet 0   No current facility-administered medications on file prior to visit.    Allergies  Allergen Reactions  . Bee Venom Anaphylaxis    Anaphylaxis     Family History  Problem Relation Age of Onset  . Diabetes Father   . Hypertension Father   . Hypertension Mother   . Other Neg Hx     No FH of CAD, CVA  . Alcohol abuse Maternal Grandfather   . Cancer Maternal Grandfather     liver  . Diabetes Paternal Grandmother     History   Social History  . Marital Status: Married    Spouse Name: N/A      Number of Children: N/A  . Years of Education: N/A   Social History Main Topics  . Smoking status: Never Smoker   . Smokeless tobacco: Never Used  . Alcohol Use: No     Comment: rarely  . Drug Use: No  . Sexual Activity: None   Other Topics Concern  . 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  All other ROS are negative.  BP 139/92 mmHg  Pulse 93  Temp(Src) 98.8 F (37.1 C) (Oral)  Resp 16  Ht 5\' 8"  (1.727 m)  Wt 182 lb 8 oz (82.781 kg)  BMI 27.76 kg/m2  SpO2 99%  Physical Exam  Constitutional: He is oriented to person, place, and time and well-developed, well-nourished, and in no distress.  HENT:  Head: Normocephalic and atraumatic.  Eyes: Conjunctivae are normal.  Cardiovascular: Normal rate, regular rhythm, normal heart sounds and intact distal pulses.   Pulmonary/Chest: Effort normal and breath sounds normal. No respiratory distress. He has no wheezes. He has no rales. He exhibits no tenderness.  Abdominal: Soft. Normal appearance and bowel sounds are  normal. There is no hepatosplenomegaly. There is tenderness in the suprapubic area. There is no rebound and no CVA tenderness. No hernia.  Genitourinary: Penis normal. He exhibits no abnormal testicular mass, no testicular tenderness, no abnormal scrotal mass, no scrotal tenderness and no epididymal tenderness. No discharge found.  Musculoskeletal:       Lumbar back: He exhibits normal range of motion, no tenderness, no bony tenderness, no pain and no spasm.  Neurological: He is alert and oriented to person, place, and time.  Skin: Skin is warm and dry. No rash noted.  Psychiatric: Affect normal.  Vitals reviewed.  Recent Results (from the past 2160 hour(s))  CBC     Status: Abnormal   Collection Time: 07/08/14  9:31 AM  Result Value Ref  Range   WBC 3.6 (L) 4.0 - 10.5 K/uL   RBC 5.08 4.22 - 5.81 Mil/uL   Platelets 185.0 150.0 - 400.0 K/uL   Hemoglobin 15.8 13.0 - 17.0 g/dL   HCT 40.946.6 81.139.0 - 91.452.0 %   MCV 91.7 78.0 - 100.0 fl   MCHC 33.9 30.0 - 36.0 g/dL   RDW 78.212.7 95.611.5 - 21.315.5 %  Renal Function Panel     Status: Abnormal   Collection Time: 07/08/14  9:31 AM  Result Value Ref Range   Sodium 133 (L) 135 - 145 mEq/L   Potassium 4.4 3.5 - 5.1 mEq/L   Chloride 99 96 - 112 mEq/L   CO2 26 19 - 32 mEq/L   Calcium 9.8 8.4 - 10.5 mg/dL   Albumin 4.4 3.5 - 5.2 g/dL   BUN 15 6 - 23 mg/dL   Creatinine, Ser 1.0 0.4 - 1.5 mg/dL   Glucose, Bld 086123 (H) 70 - 99 mg/dL   Phosphorus 2.9 2.3 - 4.6 mg/dL   GFR 578.46108.19 >96.29>60.00 mL/min  Hepatic function panel     Status: None   Collection Time: 07/08/14  9:31 AM  Result Value Ref Range   Total Bilirubin 0.8 0.2 - 1.2 mg/dL   Bilirubin, Direct 0.0 0.0 - 0.3 mg/dL   Alkaline Phosphatase 49 39 - 117 U/L   AST 30 0 - 37 U/L   ALT 33 0 - 53 U/L   Total Protein 8.0 6.0 - 8.3 g/dL   Albumin 4.4 3.5 - 5.2 g/dL  TSH     Status: Abnormal   Collection Time: 07/08/14  9:31 AM  Result Value Ref Range   TSH 0.18 (L) 0.35 - 4.50 uIU/mL  HgB A1c     Status: None   Collection Time: 07/08/14  9:31 AM  Result Value Ref Range   Hgb A1c MFr Bld 6.5 4.6 - 6.5 %    Comment: Glycemic Control Guidelines for People with Diabetes:Non Diabetic:  <6%Goal of Therapy: <7%Additional Action Suggested:  >8%   Lipid Profile     Status: None   Collection Time: 07/08/14  9:31 AM  Result Value Ref Range   Cholesterol 157 0 - 200 mg/dL    Comment: ATP III Classification       Desirable:  < 200 mg/dL               Borderline High:  200 - 239 mg/dL          High:  > = 528240 mg/dL   Triglycerides 41.349.0 0.0 - 149.0 mg/dL    Comment: Normal:  <244<150 mg/dLBorderline High:  150 - 199 mg/dL   HDL 01.0252.10 >72.53>39.00 mg/dL   VLDL 9.8 0.0 - 66.440.0 mg/dL  LDL Cholesterol 95 0 - 99 mg/dL   Total CHOL/HDL Ratio 3     Comment:                 Men          Women1/2 Average Risk     3.4          3.3Average Risk          5.0          4.42X Average Risk          9.6          7.13X Average Risk          15.0          11.0                       NonHDL 104.90     Comment: NOTE:  Non-HDL goal should be 30 mg/dL higher than patient's LDL goal (i.e. LDL goal of < 70 mg/dL, would have non-HDL goal of < 100 mg/dL)  T4, free     Status: None   Collection Time: 07/09/14  9:17 AM  Result Value Ref Range   Free T4 1.08 0.60 - 1.60 ng/dL  POCT urinalysis dipstick     Status: None   Collection Time: 09/09/14  5:12 PM  Result Value Ref Range   Color, UA yellow    Clarity, UA clear    Glucose, UA neg    Bilirubin, UA neg    Ketones, UA trace    Spec Grav, UA >=1.030    Blood, UA negative    pH, UA 5.5    Protein, UA neg    Urobilinogen, UA 0.2    Nitrite, UA neg    Leukocytes, UA Negative     Assessment/Plan: Pelvic pain in male MSK vs prostatitis.  Urine dip unremarkable. Will send urine for culture. Will begin Levaquin daily while culture is processing. Mobic daily for inflammation. Avoid heavy lifting, bike riding.  Follow-up in 1-2 weeks.

## 2014-09-09 NOTE — Progress Notes (Signed)
Pre visit review using our clinic review tool, if applicable. No additional management support is needed unless otherwise documented below in the visit note/SLS  

## 2014-09-10 DIAGNOSIS — R102 Pelvic and perineal pain: Secondary | ICD-10-CM | POA: Insufficient documentation

## 2014-09-10 NOTE — Assessment & Plan Note (Signed)
MSK vs prostatitis.  Urine dip unremarkable. Will send urine for culture. Will begin Levaquin daily while culture is processing. Mobic daily for inflammation. Avoid heavy lifting, bike riding.  Follow-up in 1-2 weeks.

## 2014-09-18 ENCOUNTER — Other Ambulatory Visit: Payer: Self-pay | Admitting: Family Medicine

## 2014-09-22 ENCOUNTER — Encounter: Payer: Self-pay | Admitting: Physician Assistant

## 2014-09-23 ENCOUNTER — Other Ambulatory Visit: Payer: Self-pay | Admitting: Family Medicine

## 2014-09-23 DIAGNOSIS — R102 Pelvic and perineal pain: Secondary | ICD-10-CM

## 2014-09-23 MED ORDER — LEVOFLOXACIN 500 MG PO TABS: 500.0000 mg | ORAL_TABLET | Freq: Every day | ORAL | Status: DC

## 2014-10-07 ENCOUNTER — Ambulatory Visit (INDEPENDENT_AMBULATORY_CARE_PROVIDER_SITE_OTHER): Payer: Federal, State, Local not specified - PPO | Admitting: Nurse Practitioner

## 2014-10-07 ENCOUNTER — Encounter: Payer: Self-pay | Admitting: Nurse Practitioner

## 2014-10-07 ENCOUNTER — Ambulatory Visit (HOSPITAL_COMMUNITY): Payer: Federal, State, Local not specified - PPO | Attending: Cardiovascular Disease

## 2014-10-07 DIAGNOSIS — R0789 Other chest pain: Secondary | ICD-10-CM

## 2014-10-07 DIAGNOSIS — E785 Hyperlipidemia, unspecified: Secondary | ICD-10-CM | POA: Diagnosis not present

## 2014-10-07 DIAGNOSIS — I517 Cardiomegaly: Secondary | ICD-10-CM | POA: Diagnosis present

## 2014-10-07 DIAGNOSIS — E119 Type 2 diabetes mellitus without complications: Secondary | ICD-10-CM | POA: Diagnosis not present

## 2014-10-07 DIAGNOSIS — I10 Essential (primary) hypertension: Secondary | ICD-10-CM | POA: Diagnosis not present

## 2014-10-07 NOTE — Progress Notes (Signed)
2D Echo completed. 10/07/2014 

## 2014-10-07 NOTE — Progress Notes (Signed)
Exercise Treadmill Test  Pre-Exercise Testing Evaluation Rhythm: normal sinus  Rate: 76 bpm     Test  Exercise Tolerance Test Ordering MD: Tobias AlexanderKatarina Nelson, MD  Interpreting MD: Norma FredricksonLori Gerhardt, NP  Unique Test No: 1  Treadmill:  1  Indication for ETT: chest pain - rule out ischemia  Contraindication to ETT: No   Stress Modality: exercise - treadmill  Cardiac Imaging Performed: non   Protocol: standard Bruce - maximal  Max BP:  187/91  Max MPHR (bpm):  179 85% MPR (bpm):  152  MPHR obtained (bpm):  173 % MPHR obtained:  96%  Reached 85% MPHR (min:sec):  7:00 Total Exercise Time (min-sec):  10 minutes  Workload in METS:  11.7 Borg Scale: 14  Reason ETT Terminated:  desired heart rate attained    ST Segment Analysis At Rest: normal ST segments - no evidence of significant ST depression With Exercise: no evidence of significant ST depression  Other Information Arrhythmia:  No Angina during ETT:  absent (0) Quality of ETT:  diagnostic  ETT Interpretation:  normal - no evidence of ischemia by ST analysis  Comments: Patient presents today for routine GXT. Has had atypical chest pain with multiple CV risk factors. Currently without symptoms.   Today the patient exercised on the standard Bruce protocol for a total of 10 minutes.  Good exercise tolerance.  Adequate blood pressure response.  Clinically negative for chest pain. Test was stopped due to achievement of target HR/patient's desire to stop.  EKG negative for ischemia. No arrhythmia noted.    Recommendations: CV risk factor modification  See back as planned  Call the [facility] office at [phone] if you have any questions, problems or concerns.   Rosalio MacadamiaLori C. Gerhardt, RN, ANP-C [facility] [address] [place], [place]  [id number] [phone]

## 2014-10-10 NOTE — Progress Notes (Signed)
Normal stress test and normal echo, normal heart function, no further work up needed.

## 2014-10-13 ENCOUNTER — Ambulatory Visit (INDEPENDENT_AMBULATORY_CARE_PROVIDER_SITE_OTHER): Payer: Federal, State, Local not specified - PPO | Admitting: Family Medicine

## 2014-10-13 ENCOUNTER — Encounter: Payer: Self-pay | Admitting: Family Medicine

## 2014-10-13 VITALS — BP 136/82 | HR 70 | Ht 68.0 in | Wt 186.0 lb

## 2014-10-13 DIAGNOSIS — M9902 Segmental and somatic dysfunction of thoracic region: Secondary | ICD-10-CM

## 2014-10-13 DIAGNOSIS — M9901 Segmental and somatic dysfunction of cervical region: Secondary | ICD-10-CM

## 2014-10-13 DIAGNOSIS — M9908 Segmental and somatic dysfunction of rib cage: Secondary | ICD-10-CM

## 2014-10-13 DIAGNOSIS — M5090 Cervical disc disorder, unspecified, unspecified cervical region: Secondary | ICD-10-CM

## 2014-10-13 DIAGNOSIS — R102 Pelvic and perineal pain: Secondary | ICD-10-CM

## 2014-10-13 DIAGNOSIS — M999 Biomechanical lesion, unspecified: Secondary | ICD-10-CM

## 2014-10-13 MED ORDER — DOXYCYCLINE HYCLATE 100 MG PO TABS: 100.0000 mg | ORAL_TABLET | Freq: Two times a day (BID) | ORAL | Status: DC

## 2014-10-13 NOTE — Assessment & Plan Note (Signed)
Will treat patient for subacute prostatitis. Patient was put on doxycycline for 14 days. Patient will come back in 2-3 weeks for further evaluation. If continuing to have difficult he labs will be drawn.

## 2014-10-13 NOTE — Assessment & Plan Note (Signed)
Patient continues to do well with conservative therapy. Encourage patient to continue lifting but we discussed proper positioning. Patient showed proper lifting mechanics. Patient was told to avoid any overhead activity. Patient then is going to come back and see me again in 6 weeks for further evaluation and treatment.  Spent greater than 25 minutes with patient face-to-face and had greater than 50% of counseling including as described above in assessment and plan.

## 2014-10-13 NOTE — Patient Instructions (Addendum)
Good to see you.   We will treat the prostate with doxy for next 14 days.  Neck is doing good Watch your lifting with your hands in vision at all times Dr. Tana Conch could be good.  I will send him the note.  See me again in 3-4 weeks.

## 2014-10-13 NOTE — Progress Notes (Signed)
[name] [address] [place], [place] [address] Phone: [phone] Subjective:     CC: Right arm pain  BJY:[id number] Darren Salazar is a 44 y.o. male coming in with complaint of right arm pain after weightlifting. Patient has been seen previously for other problems. Patient states he has multiple problems.   Patient states that he started having right arm pain, significantly better but having more isolation to his right shoulder. Patient has been lifting weights and did notice that it seems to happen with this. Denies any weakness. States that it is more stiff in the morning but seems to get better throughout the day. Patient denies any radiation of the arm or any numbness. Overall continues to improve and not stopping him from daily activities but more of a soreness..   Patient also has some cervical neck pain. Since 2013 patient is known that he does have cervical radiculopathy with impingement on the left side. Patient started having worsening pain in the neck and then went for an epidural steroid injection recently and it did not get any significant improvement. Patient has been responding very well to osteopathic manipulation. Patient states his neck has felt significant better. Patient is very happy with the results.   Patient was also seen by primary care provider recently and was treated for a prostatitis. Patient states that when he was on the medication and seemed to improve but now is having similar presentation of pain again. Patient was treated for that 3 day dose as well as a 7 day dose of Levaquin with mild to moderate improvement. Patient denies any fevers or chills but does have loss of appetite.    Past medical history, social, surgical and family history all reviewed in electronic medical record.   Review of Systems: No headache, visual changes, nausea, vomiting, diarrhea, constipation, dizziness, abdominal pain, skin rash, fevers, chills,  night sweats, weight loss, swollen lymph nodes, body aches, joint swelling, muscle aches, chest pain, shortness of breath, mood changes.   Objective Blood pressure 136/82, pulse 70, height  (1.727 m), weight 186 lb (84.369 kg), SpO2 96 %.  General: No apparent distress alert and oriented x3 mood and affect normal, dressed appropriately.  HEENT: Pupils equal, extraocular movements intact  Respiratory: Patient's speak in full sentences and does not appear short of breath  Cardiovascular: No lower extremity edema, non tender, no erythema  Skin: Warm dry intact with no signs of infection or rash on extremities or on axial skeleton.  Abdomen: Soft minimally tender in the super pubic area. Neuro: Cranial nerves II through XII are intact, neurovascularly intact in all extremities with 2+ DTRs and 2+ pulses.  Lymph: No lymphadenopathy of posterior or anterior cervical chain or axillae bilaterally.  Gait normal with good balance and coordination.  MSK:  Non tender with full range of motion and good stability and symmetric strength and tone of shoulders,  wrist, hip, knee and ankles bilaterally.   Elbow: Right Unremarkable to inspection. Range of motion full pronation, supination, flexion, extension. Strength is full to all of the above directions Stable to varus, valgus stress. Negative moving valgus stress test. Nontender on exam Ulnar nerve does not sublux. Negative cubital tunnel Tinel's. Contralateral elbow unremarkable  Neck: Inspection unremarkable. No palpable stepoffs. Negative Spurling's maneuver today.. Near full range of motion Grip strength and sensation normal in bilateral hands Strength good C4 to T1 distribution No sensory change to C4 to T1 Negative Hoffman sign bilaterally Reflexes  normal  OMT Physical Exam  Cervical  C2 F RS right  C7 F RS right  Thoracic T1 E RS left with elevated first rib  T5 E RS left.   Lumbar L2 flexed rotated inside that  right       Impression and Recommendations:     This case required medical decision making of moderate complexity.

## 2014-10-13 NOTE — Assessment & Plan Note (Signed)
Decision today to treat with OMT was based on Physical Exam  After verbal consent patient was treated with ME, FPR techniques in cervical, rib, thoracic areas  Patient tolerated the procedure well with improvement in symptoms  Patient given exercises, stretches and lifestyle modifications  See medications in patient instructions if given  Patient will follow up in 6 weeks

## 2014-10-24 ENCOUNTER — Telehealth: Payer: Self-pay | Admitting: Family Medicine

## 2014-10-24 NOTE — Telephone Encounter (Signed)
Fine with me if ok with Dr. Abner GreenspanBlyth.

## 2014-10-24 NOTE — Telephone Encounter (Signed)
OK with me.

## 2014-10-24 NOTE — Telephone Encounter (Signed)
Patient would like to switch from Dr. Abner GreenspanBlyth to Dr. Durene CalHunter. Please advise if okay. Patient is aware that Dr. Erasmo LeventhalHunter's next appointment is late May - early June.

## 2014-10-27 NOTE — Telephone Encounter (Signed)
LMOM for pt.  

## 2014-11-04 ENCOUNTER — Ambulatory Visit (INDEPENDENT_AMBULATORY_CARE_PROVIDER_SITE_OTHER): Payer: Federal, State, Local not specified - PPO | Admitting: Family Medicine

## 2014-11-04 ENCOUNTER — Encounter: Payer: Self-pay | Admitting: Family Medicine

## 2014-11-04 VITALS — BP 128/78 | HR 81 | Ht 68.0 in | Wt 184.0 lb

## 2014-11-04 DIAGNOSIS — M9908 Segmental and somatic dysfunction of rib cage: Secondary | ICD-10-CM

## 2014-11-04 DIAGNOSIS — M5090 Cervical disc disorder, unspecified, unspecified cervical region: Secondary | ICD-10-CM

## 2014-11-04 DIAGNOSIS — M9901 Segmental and somatic dysfunction of cervical region: Secondary | ICD-10-CM

## 2014-11-04 DIAGNOSIS — M9902 Segmental and somatic dysfunction of thoracic region: Secondary | ICD-10-CM

## 2014-11-04 DIAGNOSIS — M999 Biomechanical lesion, unspecified: Secondary | ICD-10-CM

## 2014-11-04 NOTE — Progress Notes (Signed)
  [facility]  [facility] [address] [place], [place] [address] Phone: [phone] Subjective:     CC: Right arm pain and neck pain follow up  BJY:NWGNFAOZHYHPI:Subjective Darren DaftLarry T Salazar is a 44 y.o. male coming in for follow-up of right arm pain and neck pain. Patient says that overall he is feeling very good. Patient is having some mild discomfort in the upper back but overall feeling much better. Patient has stopped lifting heavy weights and is doing more weightbearing exercises. Patient states that he has noticed improvement with the pain that he was having in his neck. Patient is happy with the results so far. Patient continues with the gabapentin on a more as-needed basis than truly daily. Otherwise not taking any other medications very regularly.  Past medical history: Patient also has some cervical neck pain. Since 2013 patient is known that he does have cervical radiculopathy with impingement on the left side.  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    Past medical history, social, surgical and family history all reviewed in electronic medical record.   Review of Systems: No headache, visual changes, nausea, vomiting, diarrhea, constipation, dizziness, abdominal pain, skin rash, fevers, chills, night sweats, weight loss, swollen lymph nodes, body aches, joint swelling, muscle aches, chest pain, shortness of breath, mood changes.   Objective Blood pressure 128/78, pulse 81, height 5\' 8"  (1.727 m), weight 184 lb (83.462 kg), SpO2 97 %.  General: No apparent distress alert and oriented x3 mood and affect normal, dressed appropriately.  HEENT: Pupils equal, extraocular movements intact  Respiratory: Patient's speak in full sentences and does not appear short of breath  Cardiovascular: No lower extremity edema, non tender, no erythema  Skin: Warm dry intact with no signs of infection or rash on  extremities or on axial skeleton.  Abdomen: Soft minimally tender in the super pubic area. Neuro: Cranial nerves II through XII are intact, neurovascularly intact in all extremities with 2+ DTRs and 2+ pulses.  Lymph: No lymphadenopathy of posterior or anterior cervical chain or axillae bilaterally.  Gait normal with good balance and coordination.  MSK:  Non tender with full range of motion and good stability and symmetric strength and tone of shoulders,  wrist, hip, knee and ankles bilaterally.    Neck: Inspection unremarkable. No palpable stepoffs. Negative Spurling's maneuver today.. Near full range of motion Grip strength and sensation normal in bilateral hands Strength good C4 to T1 distribution No sensory change to C4 to T1 Negative Hoffman sign bilaterally Reflexes normal  OMT Physical Exam  Cervical  C2 F RS right C4 flexed rotated and side bent left  C7 F RS right  Thoracic T1 E RS left with elevated first rib  T5 E RS left.   Lumbar L2 flexed rotated inside that right       Impression and Recommendations:     This case required medical decision making of moderate complexity.

## 2014-11-04 NOTE — Patient Instructions (Addendum)
Good to see you You are doing great I like the new exercises for you.  Continue the vitamins I got nothing to add.  See me in 4-5 weeks

## 2014-11-04 NOTE — Assessment & Plan Note (Signed)
Decision today to treat with OMT was based on Physical Exam  After verbal consent patient was treated with ME, FPR techniques in cervical, rib, thoracic areas  Patient tolerated the procedure well with improvement in symptoms  Patient given exercises, stretches and lifestyle modifications  See medications in patient instructions if given  Patient will follow up in 4-6 weeks

## 2014-11-04 NOTE — Assessment & Plan Note (Signed)
Patient is doing significantly better with the home exercises and the osteopathic manipulation. Encourage patient to continue up with the home exercises that he is doing at this time. Patient knows he will come back sooner if any worsening pain or radicular symptoms return. We discussed the icing protocol again. Patient will come back in 4-5 weeks.

## 2014-12-10 ENCOUNTER — Encounter: Payer: Self-pay | Admitting: *Deleted

## 2014-12-10 ENCOUNTER — Telehealth: Payer: Self-pay | Admitting: *Deleted

## 2014-12-10 DIAGNOSIS — S82009A Unspecified fracture of unspecified patella, initial encounter for closed fracture: Secondary | ICD-10-CM | POA: Insufficient documentation

## 2014-12-10 NOTE — Telephone Encounter (Signed)
Pre-Visit Call completed with patient and chart updated.   Pre-Visit Info documented in Specialty Comments under [employer].    

## 2014-12-11 ENCOUNTER — Ambulatory Visit (INDEPENDENT_AMBULATORY_CARE_PROVIDER_SITE_OTHER): Payer: Federal, State, Local not specified - PPO | Admitting: Family Medicine

## 2014-12-11 ENCOUNTER — Encounter: Payer: Self-pay | Admitting: Family Medicine

## 2014-12-11 VITALS — BP 130/84 | HR 80 | Ht 68.0 in | Wt 180.0 lb

## 2014-12-11 DIAGNOSIS — M9908 Segmental and somatic dysfunction of rib cage: Secondary | ICD-10-CM

## 2014-12-11 DIAGNOSIS — M9901 Segmental and somatic dysfunction of cervical region: Secondary | ICD-10-CM

## 2014-12-11 DIAGNOSIS — M5090 Cervical disc disorder, unspecified, unspecified cervical region: Secondary | ICD-10-CM

## 2014-12-11 DIAGNOSIS — M999 Biomechanical lesion, unspecified: Secondary | ICD-10-CM

## 2014-12-11 DIAGNOSIS — M9902 Segmental and somatic dysfunction of thoracic region: Secondary | ICD-10-CM

## 2014-12-11 NOTE — Patient Instructions (Signed)
You are doing great! Whatever you are doing keep it up Good luck with soccer See me again in 2 months.

## 2014-12-11 NOTE — Progress Notes (Signed)
  [facility] D.[facility] [address] [place], [place] [address] Phone: [phone] Subjective:     CC: Right arm pain and neck pain follow up  BMW:UXLKGMWNUUHPI:Subjective Darren Salazar is a 44 y.o. male coming in for follow-up of right arm pain and neck pain. Patient says that overall he is feeling very good. Patient is having some mild discomfort in the upper back but overall feeling much better. Patient has stopped lifting heavy weights and is doing more weightbearing exercises. Continues to do very well. No new symptoms.    Past medical history: Patient also has some cervical neck pain. Since 2013 patient is known that he does have cervical radiculopathy with impingement on the left side.     Past medical history, social, surgical and family history all reviewed in electronic medical record.   Review of Systems: No headache, visual changes, nausea, vomiting, diarrhea, constipation, dizziness, abdominal pain, skin rash, fevers, chills, night sweats, weight loss, swollen lymph nodes, body aches, joint swelling, muscle aches, chest pain, shortness of breath, mood changes.   Objective Blood pressure 130/84, pulse 80, height 5\' 8"  (1.727 m), weight 180 lb (81.647 kg), SpO2 97 %.  General: No apparent distress alert and oriented x3 mood and affect normal, dressed appropriately.  HEENT: Pupils equal, extraocular movements intact  Respiratory: Patient's speak in full sentences and does not appear short of breath  Cardiovascular: No lower extremity edema, non tender, no erythema  Skin: Warm dry intact with no signs of infection or rash on extremities or on axial skeleton.  Abdomen: Soft minimally tender in the super pubic area. Neuro: Cranial nerves II through XII are intact, neurovascularly intact in all extremities with 2+ DTRs and 2+ pulses.  Lymph: No lymphadenopathy of posterior or anterior cervical chain or axillae bilaterally.  Gait normal with good balance and coordination.    MSK:  Non tender with full range of motion and good stability and symmetric strength and tone of shoulders,  wrist, hip, knee and ankles bilaterally.    Neck: Inspection unremarkable. No palpable stepoffs. Negative Spurling's maneuver today.. Near full range of motion Grip strength and sensation normal in bilateral hands Strength good C4 to T1 distribution No sensory change to C4 to T1 Negative Hoffman sign bilaterally Reflexes normal  OMT Physical Exam  Cervical  C2 F RS right C4 flexed rotated and side bent left  C7 F RS right  Thoracic T1 E RS left with elevated first rib  T5 E RS left.   Lumbar L2 flexed rotated inside that right       Impression and Recommendations:     This case required medical decision making of moderate complexity.

## 2014-12-11 NOTE — Assessment & Plan Note (Signed)
She was having no radicular symptoms at this time. Patient has been feeling significant better as long as we continue the medications on a fairly regular basis. Encourage patient to continue to do the postural changes. Patient and will come back and see me again in 6-8 weeks for further evaluation and treatment.

## 2014-12-11 NOTE — Assessment & Plan Note (Signed)
Decision today to treat with OMT was based on Physical Exam  After verbal consent patient was treated with ME, FPR techniques in cervical, rib, thoracic areas  Patient tolerated the procedure well with improvement in symptoms  Patient given exercises, stretches and lifestyle modifications  See medications in patient instructions if given  Patient will follow up in 6-8 weeks

## 2014-12-11 NOTE — Progress Notes (Signed)
Pre visit review using our clinic review tool, if applicable. No additional management support is needed unless otherwise documented below in the visit note. 

## 2014-12-12 ENCOUNTER — Encounter: Payer: Self-pay | Admitting: Family Medicine

## 2014-12-12 ENCOUNTER — Ambulatory Visit (INDEPENDENT_AMBULATORY_CARE_PROVIDER_SITE_OTHER): Payer: Federal, State, Local not specified - PPO | Admitting: Family Medicine

## 2014-12-12 VITALS — BP 144/92 | HR 82 | Temp 98.6°F | Ht 68.5 in | Wt 179.5 lb

## 2014-12-12 DIAGNOSIS — K602 Anal fissure, unspecified: Secondary | ICD-10-CM

## 2014-12-12 DIAGNOSIS — E782 Mixed hyperlipidemia: Secondary | ICD-10-CM

## 2014-12-12 DIAGNOSIS — I10 Essential (primary) hypertension: Secondary | ICD-10-CM

## 2014-12-12 DIAGNOSIS — Z0000 Encounter for general adult medical examination without abnormal findings: Secondary | ICD-10-CM

## 2014-12-12 DIAGNOSIS — E119 Type 2 diabetes mellitus without complications: Secondary | ICD-10-CM

## 2014-12-12 DIAGNOSIS — N419 Inflammatory disease of prostate, unspecified: Secondary | ICD-10-CM

## 2014-12-12 LAB — COMPREHENSIVE METABOLIC PANEL
ALT: 29 U/L (ref 0–53)
AST: 23 U/L (ref 0–37)
Albumin: 4.8 g/dL (ref 3.5–5.2)
Alkaline Phosphatase: 54 U/L (ref 39–117)
BUN: 16 mg/dL (ref 6–23)
CO2: 28 meq/L (ref 19–32)
CREATININE: 0.92 mg/dL (ref 0.40–1.50)
Calcium: 10 mg/dL (ref 8.4–10.5)
Chloride: 99 mEq/L (ref 96–112)
GFR: 116.12 mL/min (ref >60.00)
Glucose, Bld: 113 mg/dL — ABNORMAL HIGH (ref 70–99)
POTASSIUM: 3.8 meq/L (ref 3.5–5.1)
Sodium: 134 mEq/L — ABNORMAL LOW (ref 135–145)
Total Bilirubin: 0.7 mg/dL (ref 0.2–1.2)
Total Protein: 7.7 g/dL (ref 6.0–8.3)

## 2014-12-12 LAB — LIPID PANEL
CHOLESTEROL: 162 mg/dL (ref 0–200)
HDL: 59.4 mg/dL (ref >39.00)
LDL Cholesterol: 91 mg/dL (ref 0–99)
NONHDL: 102.6
Total CHOL/HDL Ratio: 3
Triglycerides: 56 mg/dL (ref 0.0–149.0)
VLDL: 11.2 mg/dL (ref 0.0–40.0)

## 2014-12-12 LAB — CBC
HCT: 47 % (ref 39.0–52.0)
HEMOGLOBIN: 15.8 g/dL (ref 13.0–17.0)
MCHC: 33.6 g/dL (ref 30.0–36.0)
MCV: 89.7 fl (ref 78.0–100.0)
PLATELETS: 215 10*3/uL (ref 150.0–400.0)
RBC: 5.24 Mil/uL (ref 4.22–5.81)
RDW: 12.8 % (ref 11.5–15.5)
WBC: 4 10*3/uL (ref 4.0–10.5)

## 2014-12-12 LAB — TSH: TSH: 0.65 u[IU]/mL (ref 0.35–4.50)

## 2014-12-12 LAB — HEMOGLOBIN A1C: Hgb A1c MFr Bld: 6.5 % (ref 4.6–6.5)

## 2014-12-12 LAB — PSA: PSA: 0.92 ng/mL (ref 0.10–4.00)

## 2014-12-12 MED ORDER — HYDROCORTISONE ACETATE 25 MG RE SUPP: 25.0000 mg | Freq: Every evening | RECTAL | Status: DC | PRN

## 2014-12-12 MED ORDER — METFORMIN HCL ER 500 MG PO TB24: 500.0000 mg | ORAL_TABLET | Freq: Two times a day (BID) | ORAL | Status: DC

## 2014-12-12 MED ORDER — LISINOPRIL-HYDROCHLOROTHIAZIDE 20-12.5 MG PO TABS: 1.0000 | ORAL_TABLET | Freq: Every day | ORAL | Status: DC
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
## 2014-12-12 NOTE — Progress Notes (Signed)
Pre visit review using our clinic review tool, if applicable. No additional management support is needed unless otherwise documented below in the visit note. 

## 2014-12-12 NOTE — Patient Instructions (Signed)
Probiotic daily such as NOW multistrain, at [email or web address]    Preventive Care for Adults A healthy lifestyle and preventive care can promote health and wellness. Preventive health guidelines for men include the following key practices:  A routine yearly physical is a good way to check with your health care provider about your health and preventative screening. It is a chance to share any concerns and updates on your health and to receive a thorough exam.  Visit your dentist for a routine exam and preventative care every 6 months. Brush your teeth twice a day and floss once a day. Good oral hygiene prevents tooth decay and gum disease.  The frequency of eye exams is based on your age, health, family medical history, use of contact lenses, and other factors. Follow your health care provider's recommendations for frequency of eye exams.  Eat a healthy diet. Foods such as vegetables, fruits, whole grains, low-fat dairy products, and lean protein foods contain the nutrients you need without too many calories. Decrease your intake of foods high in solid fats, added sugars, and salt. Eat the right amount of calories for you.Get information about a proper diet from your health care provider, if necessary.  Regular physical exercise is one of the most important things you can do for your health. Most adults should get at least 150 minutes of moderate-intensity exercise (any activity that increases your heart rate and causes you to sweat) each week. In addition, most adults need muscle-strengthening exercises on 2 or more days a week.  Maintain a healthy weight. The body mass index (BMI) is a screening tool to identify possible weight problems. It provides an estimate of body fat based on height and weight. Your health care provider can find your BMI and can help you achieve or maintain a healthy weight.For adults 20 years and older:  A BMI below 18.5 is considered underweight.  A BMI of 18.5 to 24.9  is normal.  A BMI of 25 to 29.9 is considered overweight.  A BMI of 30 and above is considered obese.  Maintain normal blood lipids and cholesterol levels by exercising and minimizing your intake of saturated fat. Eat a balanced diet with plenty of fruit and vegetables. Blood tests for lipids and cholesterol should begin at age 70 and be repeated every 5 years. If your lipid or cholesterol levels are high, you are over 50, or you are at high risk for heart disease, you may need your cholesterol levels checked more frequently.Ongoing high lipid and cholesterol levels should be treated with medicines if diet and exercise are not working.  If you smoke, find out from your health care provider how to quit. If you do not use tobacco, do not start.  Lung cancer screening is recommended for adults aged 24-80 years who are at high risk for developing lung cancer because of a history of smoking. A yearly low-dose CT scan of the lungs is recommended for people who have at least a 30-pack-year history of smoking and are a current smoker or have quit within the past 15 years. A pack year of smoking is smoking an average of 1 pack of cigarettes a day for 1 year (for example: 1 pack a day for 30 years or 2 packs a day for 15 years). Yearly screening should continue until the smoker has stopped smoking for at least 15 years. Yearly screening should be stopped for people who develop a health problem that would prevent them from having lung cancer treatment.  If you choose to drink alcohol, do not have more than 2 drinks per day. One drink is considered to be 12 ounces (355 mL) of beer, 5 ounces (148 mL) of wine, or 1.5 ounces (44 mL) of liquor.  Avoid use of street drugs. Do not share needles with anyone. Ask for help if you need support or instructions about stopping the use of drugs.  High blood pressure causes heart disease and increases the risk of stroke. Your blood pressure should be checked at least every  1-2 years. Ongoing high blood pressure should be treated with medicines, if weight loss and exercise are not effective.  If you are 56-58 years old, ask your health care provider if you should take aspirin to prevent heart disease.  Diabetes screening involves taking a blood sample to check your fasting blood sugar level. This should be done once every 3 years, after age 30, if you are within normal weight and without risk factors for diabetes. Testing should be considered at a younger age or be carried out more frequently if you are overweight and have at least 1 risk factor for diabetes.  Colorectal cancer can be detected and often prevented. Most routine colorectal cancer screening begins at the age of 79 and continues through age 62. However, your health care provider may recommend screening at an earlier age if you have risk factors for colon cancer. On a yearly basis, your health care provider may provide home test kits to check for hidden blood in the stool. Use of a small camera at the end of a tube to directly examine the colon (sigmoidoscopy or colonoscopy) can detect the earliest forms of colorectal cancer. Talk to your health care provider about this at age 70, when routine screening begins. Direct exam of the colon should be repeated every 5-10 years through age 39, unless early forms of precancerous polyps or small growths are found.  People who are at an increased risk for hepatitis B should be screened for this virus. You are considered at high risk for hepatitis B if:  You were born in a country where hepatitis B occurs often. Talk with your health care provider about which countries are considered high risk.  Your parents were born in a high-risk country and you have not received a shot to protect against hepatitis B (hepatitis B vaccine).  You have HIV or AIDS.  You use needles to inject street drugs.  You live with, or have sex with, someone who has hepatitis B.  You are a man  who has sex with other men (MSM).  You get hemodialysis treatment.  You take certain medicines for conditions such as cancer, organ transplantation, and autoimmune conditions.  Hepatitis C blood testing is recommended for all people born from [birth year] through 1965 and any individual with known risks for hepatitis C.  Practice safe sex. Use condoms and avoid high-risk sexual practices to reduce the spread of sexually transmitted infections (STIs). STIs include gonorrhea, chlamydia, syphilis, trichomonas, herpes, HPV, and human immunodeficiency virus (HIV). Herpes, HIV, and HPV are viral illnesses that have no cure. They can result in disability, cancer, and death.  If you are at risk of being infected with HIV, it is recommended that you take a prescription medicine daily to prevent HIV infection. This is called preexposure prophylaxis (PrEP). You are considered at risk if:  You are a man who has sex with other men (MSM) and have other risk factors.  You are a heterosexual man,  are sexually active, and are at increased risk for HIV infection.  You take drugs by injection.  You are sexually active with a partner who has HIV.  Talk with your health care provider about whether you are at high risk of being infected with HIV. If you choose to begin PrEP, you should first be tested for HIV. You should then be tested every 3 months for as long as you are taking PrEP.  A one-time screening for abdominal aortic aneurysm (AAA) and surgical repair of large AAAs by ultrasound are recommended for men ages 85 to 72 years who are current or former smokers.  Healthy men should no longer receive prostate-specific antigen (PSA) blood tests as part of routine cancer screening. Talk with your health care provider about prostate cancer screening.  Testicular cancer screening is not recommended for adult males who have no symptoms. Screening includes self-exam, a health care provider exam, and other screening tests.  Consult with your health care provider about any symptoms you have or any concerns you have about testicular cancer.  Use sunscreen. Apply sunscreen liberally and repeatedly throughout the day. You should seek shade when your shadow is shorter than you. Protect yourself by wearing long sleeves, pants, a wide-brimmed hat, and sunglasses year round, whenever you are outdoors.  Once a month, do a whole-body skin exam, using a mirror to look at the skin on your back. Tell your health care provider about new moles, moles that have irregular borders, moles that are larger than a pencil eraser, or moles that have changed in shape or color.  Stay current with required vaccines (immunizations).  Influenza vaccine. All adults should be immunized every year.  Tetanus, diphtheria, and acellular pertussis (Td, Tdap) vaccine. An adult who has not previously received Tdap or who does not know his vaccine status should receive 1 dose of Tdap. This initial dose should be followed by tetanus and diphtheria toxoids (Td) booster doses every 10 years. Adults with an unknown or incomplete history of completing a 3-dose immunization series with Td-containing vaccines should begin or complete a primary immunization series including a Tdap dose. Adults should receive a Td booster every 10 years.  Varicella vaccine. An adult without evidence of immunity to varicella should receive 2 doses or a second dose if he has previously received 1 dose.  Human papillomavirus (HPV) vaccine. Males aged 76-21 years who have not received the vaccine previously should receive the 3-dose series. Males aged 22-26 years may be immunized. Immunization is recommended through the age of 87 years for any male who has sex with males and did not get any or all doses earlier. Immunization is recommended for any person with an immunocompromised condition through the age of 21 years if he did not get any or all doses earlier. During the 3-dose series, the  second dose should be obtained 4-8 weeks after the first dose. The third dose should be obtained 24 weeks after the first dose and 16 weeks after the second dose.  Zoster vaccine. One dose is recommended for adults aged 36 years or older unless certain conditions are present.  Measles, mumps, and rubella (MMR) vaccine. Adults born before [birth year] generally are considered immune to measles and mumps. Adults born in [birth year] or later should have 1 or more doses of MMR vaccine unless there is a contraindication to the vaccine or there is laboratory evidence of immunity to each of the three diseases. A routine second dose of MMR vaccine should be obtained  at least 28 days after the first dose for students attending postsecondary schools, health care workers, or international travelers. People who received inactivated measles vaccine or an unknown type of measles vaccine during 1963-1967 should receive 2 doses of MMR vaccine. People who received inactivated mumps vaccine or an unknown type of mumps vaccine before 1979 and are at high risk for mumps infection should consider immunization with 2 doses of MMR vaccine. Unvaccinated health care workers born before [birth year] who lack laboratory evidence of measles, mumps, or rubella immunity or laboratory confirmation of disease should consider measles and mumps immunization with 2 doses of MMR vaccine or rubella immunization with 1 dose of MMR vaccine.  Pneumococcal 13-valent conjugate (PCV13) vaccine. When indicated, a person who is uncertain of his immunization history and has no record of immunization should receive the PCV13 vaccine. An adult aged 62 years or older who has certain medical conditions and has not been previously immunized should receive 1 dose of PCV13 vaccine. This PCV13 should be followed with a dose of pneumococcal polysaccharide (PPSV23) vaccine. The PPSV23 vaccine dose should be obtained at least 8 weeks after the dose of PCV13 vaccine. An adult aged 61 years  or older who has certain medical conditions and previously received 1 or more doses of PPSV23 vaccine should receive 1 dose of PCV13. The PCV13 vaccine dose should be obtained 1 or more years after the last PPSV23 vaccine dose.  Pneumococcal polysaccharide (PPSV23) vaccine. When PCV13 is also indicated, PCV13 should be obtained first. All adults aged 9 years and older should be immunized. An adult younger than age 49 years who has certain medical conditions should be immunized. Any person who resides in a nursing home or long-term care facility should be immunized. An adult smoker should be immunized. People with an immunocompromised condition and certain other conditions should receive both PCV13 and PPSV23 vaccines. People with human immunodeficiency virus (HIV) infection should be immunized as soon as possible after diagnosis. Immunization during chemotherapy or radiation therapy should be avoided. Routine use of PPSV23 vaccine is not recommended for American Indians, [place] Natives, or people younger than 65 years unless there are medical conditions that require PPSV23 vaccine. When indicated, people who have unknown immunization and have no record of immunization should receive PPSV23 vaccine. One-time revaccination 5 years after the first dose of PPSV23 is recommended for people aged 19-64 years who have chronic kidney failure, nephrotic syndrome, asplenia, or immunocompromised conditions. People who received 1-2 doses of PPSV23 before age 26 years should receive another dose of PPSV23 vaccine at age 39 years or later if at least 5 years have passed since the previous dose. Doses of PPSV23 are not needed for people immunized with PPSV23 at or after age 36 years.  Meningococcal vaccine. Adults with asplenia or persistent complement component deficiencies should receive 2 doses of quadrivalent meningococcal conjugate (MenACWY-D) vaccine. The doses should be obtained at least 2 months apart. Microbiologists  working with certain meningococcal bacteria, [employer] recruits, people at risk during an outbreak, and people who travel to or live in countries with a high rate of meningitis should be immunized. A first-year college student up through age 70 years who is living in a residence hall should receive a dose if he did not receive a dose on or after his 16th birthday. Adults who have certain high-risk conditions should receive one or more doses of vaccine.  Hepatitis A vaccine. Adults who wish to be protected from this disease, have certain high-risk conditions,  work with hepatitis A-infected animals, work in hepatitis A research labs, or travel to or work in countries with a high rate of hepatitis A should be immunized. Adults who were previously unvaccinated and who anticipate close contact with an international adoptee during the first 60 days after arrival in the [place] from a country with a high rate of hepatitis A should be immunized.  Hepatitis B vaccine. Adults should be immunized if they wish to be protected from this disease, have certain high-risk conditions, may be exposed to blood or other infectious body fluids, are household contacts or sex partners of hepatitis B positive people, are clients or workers in certain care facilities, or travel to or work in countries with a high rate of hepatitis B.  Haemophilus influenzae type b (Hib) vaccine. A previously unvaccinated person with asplenia or sickle cell disease or having a scheduled splenectomy should receive 1 dose of Hib vaccine. Regardless of previous immunization, a recipient of a hematopoietic stem cell transplant should receive a 3-dose series 6-12 months after his successful transplant. Hib vaccine is not recommended for adults with HIV infection. Preventive Service / Frequency Ages 25 to 65  Blood pressure check.** / Every 1 to 2 years.  Lipid and cholesterol check.** / Every 5 years beginning at age 84.  Hepatitis C blood  test.** / For any individual with known risks for hepatitis C.  Skin self-exam. / Monthly.  Influenza vaccine. / Every year.  Tetanus, diphtheria, and acellular pertussis (Tdap, Td) vaccine.** / Consult your health care provider. 1 dose of Td every 10 years.  Varicella vaccine.** / Consult your health care provider.  HPV vaccine. / 3 doses over 6 months, if 48 or younger.  Measles, mumps, rubella (MMR) vaccine.** / You need at least 1 dose of MMR if you were born in 1957 or later. You may also need a second dose.  Pneumococcal 13-valent conjugate (PCV13) vaccine.** / Consult your health care provider.  Pneumococcal polysaccharide (PPSV23) vaccine.** / 1 to 2 doses if you smoke cigarettes or if you have certain conditions.  Meningococcal vaccine.** / 1 dose if you are age 77 to 71 years and a Market researcher living in a residence hall, or have one of several medical conditions. You may also need additional booster doses.  Hepatitis A vaccine.** / Consult your health care provider.  Hepatitis B vaccine.** / Consult your health care provider.  Haemophilus influenzae type b (Hib) vaccine.** / Consult your health care provider. Ages 46 to 75  Blood pressure check.** / Every 1 to 2 years.  Lipid and cholesterol check.** / Every 5 years beginning at age 80.  Lung cancer screening. / Every year if you are aged 48-80 years and have a 30-pack-year history of smoking and currently smoke or have quit within the past 15 years. Yearly screening is stopped once you have quit smoking for at least 15 years or develop a health problem that would prevent you from having lung cancer treatment.  Fecal occult blood test (FOBT) of stool. / Every year beginning at age 77 and continuing until age 18. You may not have to do this test if you get a colonoscopy every 10 years.  Flexible sigmoidoscopy** or colonoscopy.** / Every 5 years for a flexible sigmoidoscopy or every 10 years for a colonoscopy  beginning at age 87 and continuing until age 82.  Hepatitis C blood test.** / For all people born from [birth year] through 1965 and any individual with known risks for  hepatitis C.  Skin self-exam. / Monthly.  Influenza vaccine. / Every year.  Tetanus, diphtheria, and acellular pertussis (Tdap/Td) vaccine.** / Consult your health care provider. 1 dose of Td every 10 years.  Varicella vaccine.** / Consult your health care provider.  Zoster vaccine.** / 1 dose for adults aged 42 years or older.  Measles, mumps, rubella (MMR) vaccine.** / You need at least 1 dose of MMR if you were born in 1955 or later. You may also need a second dose.  Pneumococcal 13-valent conjugate (PCV13) vaccine.** / Consult your health care provider.  Pneumococcal polysaccharide (PPSV23) vaccine.** / 1 to 2 doses if you smoke cigarettes or if you have certain conditions.  Meningococcal vaccine.** / Consult your health care provider.  Hepatitis A vaccine.** / Consult your health care provider.  Hepatitis B vaccine.** / Consult your health care provider.  Haemophilus influenzae type b (Hib) vaccine.** / Consult your health care provider. Ages 71 and over  Blood pressure check.** / Every 1 to 2 years.  Lipid and cholesterol check.**/ Every 5 years beginning at age 56.  Lung cancer screening. / Every year if you are aged 63-80 years and have a 30-pack-year history of smoking and currently smoke or have quit within the past 15 years. Yearly screening is stopped once you have quit smoking for at least 15 years or develop a health problem that would prevent you from having lung cancer treatment.  Fecal occult blood test (FOBT) of stool. / Every year beginning at age 59 and continuing until age 49. You may not have to do this test if you get a colonoscopy every 10 years.  Flexible sigmoidoscopy** or colonoscopy.** / Every 5 years for a flexible sigmoidoscopy or every 10 years for a colonoscopy beginning at age 85 and  continuing until age 25.  Hepatitis C blood test.** / For all people born from [birth year] through 1963 and any individual with known risks for hepatitis C.  Abdominal aortic aneurysm (AAA) screening.** / A one-time screening for ages 76 to 30 years who are current or former smokers.  Skin self-exam. / Monthly.  Influenza vaccine. / Every year.  Tetanus, diphtheria, and acellular pertussis (Tdap/Td) vaccine.** / 1 dose of Td every 10 years.  Varicella vaccine.** / Consult your health care provider.  Zoster vaccine.** / 1 dose for adults aged 43 years or older.  Pneumococcal 13-valent conjugate (PCV13) vaccine.** / Consult your health care provider.  Pneumococcal polysaccharide (PPSV23) vaccine.** / 1 dose for all adults aged 42 years and older.  Meningococcal vaccine.** / Consult your health care provider.  Hepatitis A vaccine.** / Consult your health care provider.  Hepatitis B vaccine.** / Consult your health care provider.  Haemophilus influenzae type b (Hib) vaccine.** / Consult your health care provider. **Family history and personal history of risk and conditions may change your health care provider's recommendations. Document Released: 11/22/2001 Document Revised: 10/01/2013 Document Reviewed: 02/21/2011 [facility] Patient Information 2015 [place], [place]. This information is not intended to replace advice given to you by your health care provider. Make sure you discuss any questions you have with your health care provider.

## 2014-12-14 ENCOUNTER — Encounter: Payer: Self-pay | Admitting: Family Medicine

## 2014-12-14 NOTE — Assessment & Plan Note (Signed)
Tolerating statin, encouraged heart healthy diet, avoid trans fats, minimize simple carbs and saturated fats. Increase exercise as tolerated 

## 2014-12-14 NOTE — Assessment & Plan Note (Signed)
Mildly elevated, just found out his social security number has been compromised

## 2014-12-14 NOTE — Assessment & Plan Note (Signed)
hgba1c acceptable, minimize simple carbs. Increase exercise as tolerated. Continue current meds 

## 2014-12-14 NOTE — Assessment & Plan Note (Signed)
Patient encouraged to maintain heart healthy diet, regular exercise, adequate sleep. Consider daily probiotics. Take medications as prescribed 

## 2015-02-10 ENCOUNTER — Ambulatory Visit: Payer: Federal, State, Local not specified - PPO | Admitting: Family Medicine

## 2015-02-10 DIAGNOSIS — Z0289 Encounter for other administrative examinations: Secondary | ICD-10-CM

## 2015-03-07 ENCOUNTER — Other Ambulatory Visit: Payer: Self-pay | Admitting: Family Medicine

## 2015-04-08 ENCOUNTER — Telehealth: Payer: Self-pay | Admitting: Family

## 2015-04-08 ENCOUNTER — Encounter: Payer: Self-pay | Admitting: Family Medicine

## 2015-04-08 NOTE — Telephone Encounter (Signed)
Please contact patient to arrange appointment for further evaluation in the office.

## 2015-04-08 NOTE — Telephone Encounter (Signed)
See Pt's MyChart message, also informed Pt that your were out of the office until tomorrow (Thursday) but that I would forward the message to you. Also informed Pt to discuss with dentist who removed the wisdom tooth.

## 2015-04-08 NOTE — Telephone Encounter (Signed)
Please contact pt to arrange office visit.

## 2015-04-08 NOTE — Telephone Encounter (Signed)
Appointment scheduled for 04/10/15. Offered earlier appt, pt declined.

## 2015-04-10 ENCOUNTER — Ambulatory Visit (INDEPENDENT_AMBULATORY_CARE_PROVIDER_SITE_OTHER): Payer: Federal, State, Local not specified - PPO | Admitting: Family Medicine

## 2015-04-10 ENCOUNTER — Encounter: Payer: Self-pay | Admitting: Family Medicine

## 2015-04-10 ENCOUNTER — Ambulatory Visit (HOSPITAL_BASED_OUTPATIENT_CLINIC_OR_DEPARTMENT_OTHER)
Admission: RE | Admit: 2015-04-10 | Discharge: 2015-04-10 | Disposition: A | Discharge status: Home / Self Care | Payer: Federal, State, Local not specified - PPO | Source: Ambulatory Visit | Attending: Family Medicine | Admitting: Family Medicine

## 2015-04-10 VITALS — BP 129/77 | HR 103 | Temp 98.5°F | Ht 69.0 in | Wt 177.0 lb

## 2015-04-10 DIAGNOSIS — R112 Nausea with vomiting, unspecified: Secondary | ICD-10-CM

## 2015-04-10 DIAGNOSIS — R197 Diarrhea, unspecified: Secondary | ICD-10-CM | POA: Diagnosis not present

## 2015-04-10 DIAGNOSIS — R0602 Shortness of breath: Secondary | ICD-10-CM | POA: Insufficient documentation

## 2015-04-10 DIAGNOSIS — K219 Gastro-esophageal reflux disease without esophagitis: Secondary | ICD-10-CM

## 2015-04-10 DIAGNOSIS — IMO0001 Reserved for inherently not codable concepts without codable children: Secondary | ICD-10-CM

## 2015-04-10 DIAGNOSIS — M791 Myalgia: Secondary | ICD-10-CM

## 2015-04-10 DIAGNOSIS — I10 Essential (primary) hypertension: Secondary | ICD-10-CM

## 2015-04-10 DIAGNOSIS — R509 Fever, unspecified: Secondary | ICD-10-CM

## 2015-04-10 DIAGNOSIS — M609 Myositis, unspecified: Secondary | ICD-10-CM

## 2015-04-10 DIAGNOSIS — R109 Unspecified abdominal pain: Secondary | ICD-10-CM | POA: Diagnosis not present

## 2015-04-10 DIAGNOSIS — E119 Type 2 diabetes mellitus without complications: Secondary | ICD-10-CM

## 2015-04-10 LAB — COMPREHENSIVE METABOLIC PANEL
ALT: 22 U/L (ref 0–53)
AST: 17 U/L (ref 0–37)
Albumin: 4.4 g/dL (ref 3.5–5.2)
Alkaline Phosphatase: 52 U/L (ref 39–117)
BUN: 16 mg/dL (ref 6–23)
CALCIUM: 9.4 mg/dL (ref 8.4–10.5)
CO2: 21 mEq/L (ref 19–32)
Chloride: 95 mEq/L — ABNORMAL LOW (ref 96–112)
Creat: 0.91 mg/dL (ref 0.50–1.35)
GLUCOSE: 114 mg/dL — AB (ref 70–99)
POTASSIUM: 3.8 meq/L (ref 3.5–5.3)
SODIUM: 133 meq/L — AB (ref 135–145)
TOTAL PROTEIN: 7.4 g/dL (ref 6.0–8.3)
Total Bilirubin: 1 mg/dL (ref 0.2–1.2)

## 2015-04-10 LAB — CBC
HEMATOCRIT: 48 % (ref 39.0–52.0)
HEMOGLOBIN: 17 g/dL (ref 13.0–17.0)
MCH: 31 pg (ref 26.0–34.0)
MCHC: 35.4 g/dL (ref 30.0–36.0)
MCV: 87.6 fL (ref 78.0–100.0)
MPV: 11.4 fL (ref 8.6–12.4)
Platelets: 226 10*3/uL (ref 150–400)
RBC: 5.48 MIL/uL (ref 4.22–5.81)
RDW: 12.8 % (ref 11.5–15.5)
WBC: 3.4 10*3/uL — AB (ref 4.0–10.5)

## 2015-04-10 LAB — MAGNESIUM: MAGNESIUM: 1.7 mg/dL (ref 1.5–2.5)

## 2015-04-10 MED ORDER — CLINDAMYCIN HCL 300 MG PO CAPS: 300.0000 mg | ORAL_CAPSULE | Freq: Three times a day (TID) | ORAL | Status: DC

## 2015-04-10 NOTE — Progress Notes (Signed)
Pre visit review using our clinic review tool, if applicable. No additional management support is needed unless otherwise documented below in the visit note. 

## 2015-04-10 NOTE — Patient Instructions (Signed)
Consider probiotics such as NOW probiotics daily has a 10 strain Mylanta for any reflux or burning in chest Ginger gatorade   Food Choices to Help Relieve Diarrhea When you have diarrhea, the foods you eat and your eating habits are very important. Choosing the right foods and drinks can help relieve diarrhea. Also, because diarrhea can last up to 7 days, you need to replace lost fluids and electrolytes (such as sodium, potassium, and chloride) in order to help prevent dehydration.  WHAT GENERAL GUIDELINES DO I NEED TO FOLLOW?  Slowly drink 1 cup (8 oz) of fluid for each episode of diarrhea. If you are getting enough fluid, your urine will be clear or pale yellow.  Eat starchy foods. Some good choices include white rice, white toast, pasta, low-fiber cereal, baked potatoes (without the skin), saltine crackers, and bagels.  Avoid large servings of any cooked vegetables.  Limit fruit to two servings per day. A serving is  cup or 1 small piece.  Choose foods with less than 2 g of fiber per serving.  Limit fats to less than 8 tsp (38 g) per day.  Avoid fried foods.  Eat foods that have probiotics in them. Probiotics can be found in certain dairy products.  Avoid foods and beverages that may increase the speed at which food moves through the stomach and intestines (gastrointestinal tract). Things to avoid include:  High-fiber foods, such as dried fruit, raw fruits and vegetables, nuts, seeds, and whole grain foods.  Spicy foods and high-fat foods.  Foods and beverages sweetened with high-fructose corn syrup, honey, or sugar alcohols such as xylitol, sorbitol, and mannitol. WHAT FOODS ARE RECOMMENDED? Grains White rice. White, [place], or pita breads (fresh or toasted), including plain rolls, buns, or bagels. White pasta. Saltine, soda, or graham crackers. Pretzels. Low-fiber cereal. Cooked cereals made with water (such as cornmeal, farina, or cream cereals). Plain muffins. Matzo. Melba  toast. Zwieback.  Vegetables Potatoes (without the skin). Strained tomato and vegetable juices. Most well-cooked and canned vegetables without seeds. Tender lettuce. Fruits Cooked or canned applesauce, apricots, cherries, fruit cocktail, grapefruit, peaches, pears, or plums. Fresh bananas, apples without skin, cherries, grapes, cantaloupe, grapefruit, peaches, oranges, or plums.  Meat and Other Protein Products Baked or boiled chicken. Eggs. Tofu. Fish. Seafood. Smooth peanut butter. Ground or well-cooked tender beef, ham, veal, lamb, pork, or poultry.  Dairy Plain yogurt, kefir, and unsweetened liquid yogurt. Lactose-free milk, buttermilk, or soy milk. Plain hard cheese. Beverages Sport drinks. Clear broths. Diluted fruit juices (except prune). Regular, caffeine-free sodas such as ginger ale. Water. Decaffeinated teas. Oral rehydration solutions. Sugar-free beverages not sweetened with sugar alcohols. Other Bouillon, broth, or soups made from recommended foods.  The items listed above may not be a complete list of recommended foods or beverages. Contact your dietitian for more options. WHAT FOODS ARE NOT RECOMMENDED? Grains Whole grain, whole wheat, bran, or rye breads, rolls, pastas, crackers, and cereals. Wild or brown rice. Cereals that contain more than 2 g of fiber per serving. Corn tortillas or taco shells. Cooked or dry oatmeal. Granola. Popcorn. Vegetables Raw vegetables. Cabbage, broccoli, Brussels sprouts, artichokes, baked beans, beet greens, corn, kale, legumes, peas, sweet potatoes, and yams. Potato skins. Cooked spinach and cabbage. Fruits Dried fruit, including raisins and dates. Raw fruits. Stewed or dried prunes. Fresh apples with skin, apricots, mangoes, pears, raspberries, and strawberries.  Meat and Other Protein Products Chunky peanut butter. Nuts and seeds. Beans and lentils. Tomasa Blase.  Dairy High-fat cheeses. Milk, chocolate  milk, and beverages made with milk, such as  milk shakes. Cream. Ice cream. Sweets and Desserts Sweet rolls, doughnuts, and sweet breads. Pancakes and waffles. Fats and Oils Butter. Cream sauces. Margarine. Salad oils. Plain salad dressings. Olives. Avocados.  Beverages Caffeinated beverages (such as coffee, tea, soda, or energy drinks). Alcoholic beverages. Fruit juices with pulp. Prune juice. Soft drinks sweetened with high-fructose corn syrup or sugar alcohols. Other Coconut. Hot sauce. Chili powder. Mayonnaise. Gravy. Cream-based or milk-based soups.  The items listed above may not be a complete list of foods and beverages to avoid. Contact your dietitian for more information. WHAT SHOULD I DO IF I BECOME DEHYDRATED? Diarrhea can sometimes lead to dehydration. Signs of dehydration include dark urine and dry mouth and skin. If you think you are dehydrated, you should rehydrate with an oral rehydration solution. These solutions can be purchased at pharmacies, retail stores, or online.  Drink -1 cup (120-240 mL) of oral rehydration solution each time you have an episode of diarrhea. If drinking this amount makes your diarrhea worse, try drinking smaller amounts more often. For example, drink 1-3 tsp (5-15 mL) every 5-10 minutes.  A general rule for staying hydrated is to drink 1-2 L of fluid per day. Talk to your health care provider about the specific amount you should be drinking each day. Drink enough fluids to keep your urine clear or pale yellow. Document Released: 12/17/2003 Document Revised: 10/01/2013 Document Reviewed: 08/19/2013 [facility] Patient Information 2015 [place], [place]. This information is not intended to replace advice given to you by your health care provider. Make sure you discuss any questions you have with your health care provider.

## 2015-04-11 LAB — SEDIMENTATION RATE: Sed Rate: 1 mm/hr (ref 0–15)

## 2015-04-19 ENCOUNTER — Encounter: Payer: Self-pay | Admitting: Family Medicine

## 2015-04-19 DIAGNOSIS — K529 Noninfective gastroenteritis and colitis, unspecified: Secondary | ICD-10-CM | POA: Insufficient documentation

## 2015-04-19 HISTORY — DX: Noninfective gastroenteritis and colitis, unspecified: K52.9

## 2015-04-19 NOTE — Assessment & Plan Note (Signed)
hgba1c acceptable, minimize simple carbs. Increase exercise as tolerated. Continue current meds 

## 2015-04-19 NOTE — Assessment & Plan Note (Signed)
Well controlled, no changes to meds. Encouraged heart healthy diet such as the DASH diet and exercise as tolerated.  °
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
## 2015-04-19 NOTE — Assessment & Plan Note (Signed)
Gastroenteritis, encouraged clear fluids and BRAT diet, probiotics and report worsening symptoms

## 2015-05-15 ENCOUNTER — Ambulatory Visit: Payer: Federal, State, Local not specified - PPO | Admitting: Family Medicine

## 2015-06-09 ENCOUNTER — Encounter: Payer: Self-pay | Admitting: Family Medicine

## 2015-06-09 ENCOUNTER — Ambulatory Visit (INDEPENDENT_AMBULATORY_CARE_PROVIDER_SITE_OTHER): Payer: Federal, State, Local not specified - PPO | Admitting: Family Medicine

## 2015-06-09 VITALS — BP 110/80 | HR 76 | Ht 69.0 in | Wt 186.0 lb

## 2015-06-09 DIAGNOSIS — M5090 Cervical disc disorder, unspecified, unspecified cervical region: Secondary | ICD-10-CM | POA: Diagnosis not present

## 2015-06-09 DIAGNOSIS — M9902 Segmental and somatic dysfunction of thoracic region: Secondary | ICD-10-CM

## 2015-06-09 DIAGNOSIS — M9901 Segmental and somatic dysfunction of cervical region: Secondary | ICD-10-CM

## 2015-06-09 DIAGNOSIS — M9908 Segmental and somatic dysfunction of rib cage: Secondary | ICD-10-CM

## 2015-06-09 DIAGNOSIS — M999 Biomechanical lesion, unspecified: Secondary | ICD-10-CM

## 2015-06-09 NOTE — Assessment & Plan Note (Signed)
Patient is doing much better at this time. Encourage him to focus more on the posterolateral exercises again. Patient has not been doing them as regularly. We discussed protein supplementation as well as the over-the-counter natural supplementations. We discussed which activities to do it without the potentially avoid. Patient will come back and see me again in 3-4 weeks

## 2015-06-09 NOTE — Patient Instructions (Signed)
Good to see you as always.  You win for the neck Work on the posture changes Ice when you need it [place] sport would be good within 30-45 minutes  Of working out ad focus on stretching a little more.  See me again  In 4 weeks.

## 2015-06-09 NOTE — Progress Notes (Signed)
  [name] [address] [place], [place] [address] Phone: [phone] Subjective:     CC: Right arm pain and neck pain follow up  [id number] Darren Salazar is a 44 y.o. male coming in for follow-up of right arm pain and neck pain. Patient says that overall he is feeling very good. Patient is having some mild discomfort in the upper back but overall feeling much better. Patient has stopped lifting heavy weights and is doing more weightbearing exercises. States though that the neck seems to be getting a little tighter. Last time patient was seen was brought 5 months ago. Not having any radiation down the arm or any weakness. States though that just having more and muscle soreness after working out. States that it is starting to affect some of his daily activities again.   Past medical history: Patient also has some cervical neck pain. Since 2013 patient is known that he does have cervical radiculopathy with impingement on the left side.     Past medical history, social, surgical and family history all reviewed in electronic medical record.   Review of Systems: No headache, visual changes, nausea, vomiting, diarrhea, constipation, dizziness, abdominal pain, skin rash, fevers, chills, night sweats, weight loss, swollen lymph nodes, body aches, joint swelling, muscle aches, chest pain, shortness of breath, mood changes.   Objective Blood pressure 110/80, pulse 76, height  (1.753 m), weight 186 lb (84.369 kg), SpO2 96 %.  General: No apparent distress alert and oriented x3 mood and affect normal, dressed appropriately.  HEENT: Pupils equal, extraocular movements intact  Respiratory: Patient's speak in full sentences and does not appear short of breath  Cardiovascular: No lower extremity edema, non tender, no erythema  Skin: Warm dry intact with no signs of infection or rash on extremities or on axial skeleton.  Abdomen: Soft minimally tender in the super  pubic area. Neuro: Cranial nerves II through XII are intact, neurovascularly intact in all extremities with 2+ DTRs and 2+ pulses.  Lymph: No lymphadenopathy of posterior or anterior cervical chain or axillae bilaterally.  Gait normal with good balance and coordination.  MSK:  Non tender with full range of motion and good stability and symmetric strength and tone of shoulders,  wrist, hip, knee and ankles bilaterally.    Neck: Inspection unremarkable. No palpable stepoffs. Negative Spurling's maneuver today.. Mild tightness in range of motion but does have full passive Grip strength and sensation normal in bilateral hands Strength good C4 to T1 distribution No sensory change to C4 to T1 Negative Hoffman sign bilaterally Reflexes normal  OMT Physical Exam  Cervical  C2 F RS right C4 flexed rotated and side bent left C7 F RS right  Thoracic T1 E RS left with elevated first rib  T5 E RS left.   Lumbar L2 flexed rotated inside that right     Impression and Recommendations:     This case required medical decision making of moderate complexity.

## 2015-06-09 NOTE — Progress Notes (Signed)
Pre visit review using our clinic review tool, if applicable. No additional management support is needed unless otherwise documented below in the visit note. 

## 2015-06-09 NOTE — Assessment & Plan Note (Signed)
Decision today to treat with OMT was based on Physical Exam  After verbal consent patient was treated with ME, FPR techniques in cervical, rib, thoracic areas  Patient tolerated the procedure well with improvement in symptoms  Patient given exercises, stretches and lifestyle modifications  See medications in patient instructions if given  Patient will follow up in 4 weeks

## 2015-06-16 ENCOUNTER — Encounter: Payer: Self-pay | Admitting: Family Medicine

## 2015-06-16 ENCOUNTER — Ambulatory Visit (INDEPENDENT_AMBULATORY_CARE_PROVIDER_SITE_OTHER): Payer: Federal, State, Local not specified - PPO | Admitting: Family Medicine

## 2015-06-16 VITALS — BP 130/86 | HR 79 | Temp 98.3°F | Ht 69.0 in | Wt 185.4 lb

## 2015-06-16 DIAGNOSIS — I10 Essential (primary) hypertension: Secondary | ICD-10-CM

## 2015-06-16 DIAGNOSIS — Z23 Encounter for immunization: Secondary | ICD-10-CM

## 2015-06-16 DIAGNOSIS — R0683 Snoring: Secondary | ICD-10-CM

## 2015-06-16 DIAGNOSIS — E782 Mixed hyperlipidemia: Secondary | ICD-10-CM | POA: Diagnosis not present

## 2015-06-16 DIAGNOSIS — E119 Type 2 diabetes mellitus without complications: Secondary | ICD-10-CM

## 2015-06-16 DIAGNOSIS — E871 Hypo-osmolality and hyponatremia: Secondary | ICD-10-CM

## 2015-06-16 NOTE — Progress Notes (Signed)
Pre visit review using our clinic review tool, if applicable. No additional management support is needed unless otherwise documented below in the visit note. 

## 2015-06-16 NOTE — Patient Instructions (Signed)

## 2015-06-17 LAB — CBC
HEMATOCRIT: 46.5 % (ref 39.0–52.0)
Hemoglobin: 15.6 g/dL (ref 13.0–17.0)
MCHC: 33.6 g/dL (ref 30.0–36.0)
MCV: 91.2 fl (ref 78.0–100.0)
Platelets: 192 10*3/uL (ref 150.0–400.0)
RBC: 5.09 Mil/uL (ref 4.22–5.81)
RDW: 13.2 % (ref 11.5–15.5)
WBC: 4.7 10*3/uL (ref 4.0–10.5)

## 2015-06-17 LAB — COMPREHENSIVE METABOLIC PANEL
ALT: 32 U/L (ref 0–53)
AST: 23 U/L (ref 0–37)
Albumin: 4.6 g/dL (ref 3.5–5.2)
Alkaline Phosphatase: 51 U/L (ref 39–117)
BUN: 19 mg/dL (ref 6–23)
CHLORIDE: 97 meq/L (ref 96–112)
CO2: 27 meq/L (ref 19–32)
CREATININE: 0.81 mg/dL (ref 0.40–1.50)
Calcium: 9.9 mg/dL (ref 8.4–10.5)
GFR: 134.18 mL/min (ref >60.00)
GLUCOSE: 81 mg/dL (ref 70–99)
Potassium: 3.8 mEq/L (ref 3.5–5.1)
SODIUM: 135 meq/L (ref 135–145)
Total Bilirubin: 0.6 mg/dL (ref 0.2–1.2)
Total Protein: 7.6 g/dL (ref 6.0–8.3)

## 2015-06-17 LAB — TSH: TSH: 0.57 u[IU]/mL (ref 0.35–4.50)

## 2015-06-17 LAB — LIPID PANEL
CHOL/HDL RATIO: 3
Cholesterol: 150 mg/dL (ref 0–200)
HDL: 53 mg/dL (ref >39.00)
LDL Cholesterol: 85 mg/dL (ref 0–99)
NonHDL: 97.35
Triglycerides: 64 mg/dL (ref 0.0–149.0)
VLDL: 12.8 mg/dL (ref 0.0–40.0)

## 2015-06-17 LAB — HEMOGLOBIN A1C: HEMOGLOBIN A1C: 6.2 % (ref 4.6–6.5)

## 2015-06-22 ENCOUNTER — Other Ambulatory Visit: Payer: Self-pay | Admitting: Family Medicine

## 2015-06-28 ENCOUNTER — Encounter: Payer: Self-pay | Admitting: Family Medicine

## 2015-06-28 DIAGNOSIS — Z23 Encounter for immunization: Secondary | ICD-10-CM | POA: Insufficient documentation

## 2015-06-28 DIAGNOSIS — R0683 Snoring: Secondary | ICD-10-CM

## 2015-06-28 HISTORY — DX: Snoring: R06.83

## 2015-06-28 NOTE — Assessment & Plan Note (Signed)
Reported by patient's wife, no apnea or choking noted. Not c/o fatigue, am HA or other concerns. He is warned about concerning symptoms and will report if any new concenrs

## 2015-06-28 NOTE — Assessment & Plan Note (Signed)
hgba1c acceptable, minimize simple carbs. Increase exercise as tolerated. Continue current meds 

## 2015-06-28 NOTE — Assessment & Plan Note (Signed)
Well controlled, no changes to meds. Encouraged heart healthy diet such as the DASH diet and exercise as tolerated.  °
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
## 2015-06-28 NOTE — Assessment & Plan Note (Signed)
Given tdap today. 

## 2015-06-28 NOTE — Assessment & Plan Note (Signed)
Resolved on lab work 

## 2015-07-14 ENCOUNTER — Ambulatory Visit (INDEPENDENT_AMBULATORY_CARE_PROVIDER_SITE_OTHER): Payer: Federal, State, Local not specified - PPO | Admitting: Family Medicine

## 2015-07-14 ENCOUNTER — Encounter: Payer: Self-pay | Admitting: Family Medicine

## 2015-07-14 VITALS — BP 132/80 | HR 72 | Ht 69.0 in | Wt 187.0 lb

## 2015-07-14 DIAGNOSIS — M9908 Segmental and somatic dysfunction of rib cage: Secondary | ICD-10-CM | POA: Diagnosis not present

## 2015-07-14 DIAGNOSIS — M9902 Segmental and somatic dysfunction of thoracic region: Secondary | ICD-10-CM | POA: Diagnosis not present

## 2015-07-14 DIAGNOSIS — M5010 Cervical disc disorder with radiculopathy, unspecified cervical region: Secondary | ICD-10-CM | POA: Diagnosis not present

## 2015-07-14 DIAGNOSIS — M9901 Segmental and somatic dysfunction of cervical region: Secondary | ICD-10-CM | POA: Diagnosis not present

## 2015-07-14 DIAGNOSIS — M999 Biomechanical lesion, unspecified: Secondary | ICD-10-CM

## 2015-07-14 NOTE — Progress Notes (Signed)
Pre visit review using our clinic review tool, if applicable. No additional management support is needed unless otherwise documented below in the visit note. 

## 2015-07-14 NOTE — Assessment & Plan Note (Signed)
Patient does have degenerative disc disease and will have flared from time to time. Patient remains active and continues to work out on a regular basis. Patient is not having any radicular symptoms at this time. Continue current therapy and patient come back and see me again in 6 weeks for further evaluation and treatment.

## 2015-07-14 NOTE — Progress Notes (Signed)
  [name] [address] [place], [place] [address] Phone: [phone] Subjective:     CC: Right arm pain and neck pain follow up  Darren Salazar is a 44 y.o. male coming in for follow-up of right arm pain and neck pain. Patient says that overall he is feeling very good. Patient does have known cervical degenerative disc disease. Patient states overall he is been doing very well. Some mild tightness of the upper back and neck but nothing that is stopping him from activity. Patient continues to work out on a fairly regular basis. No trouble with work. Overall continues to improve.   Past medical history: Patient also has some cervical neck pain. Since 2013 patient is known that he does have cervical radiculopathy with impingement on the left side.     Past medical history, social, surgical and family history all reviewed in electronic medical record.   Review of Systems: No headache, visual changes, nausea, vomiting, diarrhea, constipation, dizziness, abdominal pain, skin rash, fevers, chills, night sweats, weight loss, swollen lymph nodes, body aches, joint swelling, muscle aches, chest pain, shortness of breath, mood changes.   Objective Blood pressure 132/80, pulse 72, height  (1.753 m), weight 187 lb (84.823 kg), SpO2 97 %.  General: No apparent distress alert and oriented x3 mood and affect normal, dressed appropriately.  HEENT: Pupils equal, extraocular movements intact  Respiratory: Patient's speak in full sentences and does not appear short of breath  Cardiovascular: No lower extremity edema, non tender, no erythema  Skin: Warm dry intact with no signs of infection or rash on extremities or on axial skeleton.  Abdomen: Soft minimally tender in the super pubic area. Neuro: Cranial nerves II through XII are intact, neurovascularly intact in all extremities with 2+ DTRs and 2+ pulses.  Lymph: No lymphadenopathy of posterior or  anterior cervical chain or axillae bilaterally.  Gait normal with good balance and coordination.  MSK:  Non tender with full range of motion and good stability and symmetric strength and tone of shoulders,  wrist, hip, knee and ankles bilaterally.    Neck: Inspection unremarkable. No palpable stepoffs. Negative Spurling's maneuver today.. Mild tightness in range of motion but does have full passive Grip strength and sensation normal in bilateral hands Strength good C4 to T1 distribution No sensory change to C4 to T1 Negative Hoffman sign bilaterally Reflexes normal  OMT Physical Exam  Cervical  C2 F RS right C4 flexed rotated and side bent left C7 F RS right  Thoracic T1 E RS left with elevated first rib  T5 E RS left.   Lumbar L2 flexed rotated inside that right   Impression and Recommendations:     This case required medical decision making of moderate complexity.

## 2015-07-14 NOTE — Assessment & Plan Note (Signed)
Decision today to treat with OMT was based on Physical Exam  After verbal consent patient was treated with ME, FPR techniques in cervical, rib, thoracic areas  Patient tolerated the procedure well with improvement in symptoms  Patient given exercises, stretches and lifestyle modifications  See medications in patient instructions if given  Patient will follow up in 6 weeks

## 2015-07-14 NOTE — Patient Instructions (Addendum)
Good to see you Keep doing what you are doing! Consider vitamin D 2000 IU daily Otherwise see me again in 5-6 weeks.

## 2015-07-20 ENCOUNTER — Ambulatory Visit: Payer: Federal, State, Local not specified - PPO | Admitting: Family Medicine

## 2015-08-21 ENCOUNTER — Ambulatory Visit: Payer: Federal, State, Local not specified - PPO | Admitting: Family Medicine

## 2015-09-12 ENCOUNTER — Emergency Department (HOSPITAL_BASED_OUTPATIENT_CLINIC_OR_DEPARTMENT_OTHER): Payer: Federal, State, Local not specified - PPO

## 2015-09-12 ENCOUNTER — Encounter (HOSPITAL_BASED_OUTPATIENT_CLINIC_OR_DEPARTMENT_OTHER): Payer: Self-pay | Admitting: Emergency Medicine

## 2015-09-12 ENCOUNTER — Observation Stay (HOSPITAL_BASED_OUTPATIENT_CLINIC_OR_DEPARTMENT_OTHER)
Admission: EM | Admit: 2015-09-12 | Discharge: 2015-09-14 | Disposition: A | Discharge status: Home / Self Care | Payer: Federal, State, Local not specified - PPO | Attending: Cardiology | Admitting: Cardiology

## 2015-09-12 ENCOUNTER — Observation Stay (HOSPITAL_BASED_OUTPATIENT_CLINIC_OR_DEPARTMENT_OTHER): Payer: Federal, State, Local not specified - PPO

## 2015-09-12 DIAGNOSIS — Z79899 Other long term (current) drug therapy: Secondary | ICD-10-CM | POA: Diagnosis not present

## 2015-09-12 DIAGNOSIS — E1159 Type 2 diabetes mellitus with other circulatory complications: Secondary | ICD-10-CM | POA: Diagnosis not present

## 2015-09-12 DIAGNOSIS — Z7984 Long term (current) use of oral hypoglycemic drugs: Secondary | ICD-10-CM | POA: Diagnosis not present

## 2015-09-12 DIAGNOSIS — I200 Unstable angina: Principal | ICD-10-CM | POA: Diagnosis present

## 2015-09-12 DIAGNOSIS — E7800 Pure hypercholesterolemia, unspecified: Secondary | ICD-10-CM | POA: Insufficient documentation

## 2015-09-12 DIAGNOSIS — Z8249 Family history of ischemic heart disease and other diseases of the circulatory system: Secondary | ICD-10-CM | POA: Diagnosis not present

## 2015-09-12 DIAGNOSIS — I10 Essential (primary) hypertension: Secondary | ICD-10-CM | POA: Diagnosis not present

## 2015-09-12 DIAGNOSIS — R079 Chest pain, unspecified: Secondary | ICD-10-CM | POA: Diagnosis present

## 2015-09-12 DIAGNOSIS — E119 Type 2 diabetes mellitus without complications: Secondary | ICD-10-CM

## 2015-09-12 DIAGNOSIS — F419 Anxiety disorder, unspecified: Secondary | ICD-10-CM | POA: Insufficient documentation

## 2015-09-12 DIAGNOSIS — E785 Hyperlipidemia, unspecified: Secondary | ICD-10-CM | POA: Diagnosis present

## 2015-09-12 LAB — CBC
HEMATOCRIT: 46.2 % (ref 39.0–52.0)
HEMOGLOBIN: 15.8 g/dL (ref 13.0–17.0)
MCH: 30 pg (ref 26.0–34.0)
MCHC: 34.2 g/dL (ref 30.0–36.0)
MCV: 87.8 fL (ref 78.0–100.0)
Platelets: 176 10*3/uL (ref 150–400)
RBC: 5.26 MIL/uL (ref 4.22–5.81)
RDW: 12.1 % (ref 11.5–15.5)
WBC: 3.4 10*3/uL — ABNORMAL LOW (ref 4.0–10.5)

## 2015-09-12 LAB — HEPATIC FUNCTION PANEL
ALBUMIN: 4.7 g/dL (ref 3.5–5.0)
ALK PHOS: 48 U/L (ref 38–126)
ALT: 24 U/L (ref 17–63)
AST: 26 U/L (ref 15–41)
BILIRUBIN INDIRECT: 1 mg/dL — AB (ref 0.3–0.9)
Bilirubin, Direct: 0.2 mg/dL (ref 0.1–0.5)
TOTAL PROTEIN: 8 g/dL (ref 6.5–8.1)
Total Bilirubin: 1.2 mg/dL (ref 0.3–1.2)

## 2015-09-12 LAB — BASIC METABOLIC PANEL
Anion gap: 9 (ref 5–15)
BUN: 13 mg/dL (ref 6–20)
CO2: 25 mmol/L (ref 22–32)
Calcium: 9.6 mg/dL (ref 8.9–10.3)
Chloride: 100 mmol/L — ABNORMAL LOW (ref 101–111)
Creatinine, Ser: 0.85 mg/dL (ref 0.61–1.24)
GFR calc Af Amer: >60 mL/min (ref >60)
GFR calc non Af Amer: >60 mL/min (ref >60)
Glucose, Bld: 144 mg/dL — ABNORMAL HIGH (ref 65–99)
Potassium: 3.5 mmol/L (ref 3.5–5.1)
Sodium: 134 mmol/L — ABNORMAL LOW (ref 135–145)

## 2015-09-12 LAB — CBG MONITORING, ED: Glucose-Capillary: 98 mg/dL (ref 65–99)

## 2015-09-12 LAB — APTT: APTT: 26 s (ref 24–37)

## 2015-09-12 LAB — TROPONIN I: Troponin I: <0.03 ng/mL (ref <0.031)

## 2015-09-12 LAB — PROTIME-INR
INR: 0.92 (ref 0.00–1.49)
Prothrombin Time: 12.6 seconds (ref 11.6–15.2)

## 2015-09-12 MED ORDER — NITROGLYCERIN 0.4 MG SL SUBL
0.4000 mg | SUBLINGUAL_TABLET | SUBLINGUAL | Status: DC | PRN
  Administered 2015-09-12: 0.4 mg via SUBLINGUAL
  Filled 2015-09-12: qty 1

## 2015-09-12 MED ORDER — ASPIRIN 81 MG PO CHEW
324.0000 mg | CHEWABLE_TABLET | Freq: Once | ORAL | Status: AC
  Administered 2015-09-12: 324 mg via ORAL
  Filled 2015-09-12: qty 4

## 2015-09-12 MED ORDER — LISINOPRIL 10 MG PO TABS
20.0000 mg | ORAL_TABLET | Freq: Every day | ORAL | Status: DC
  Administered 2015-09-13 – 2015-09-14 (×2): 20 mg via ORAL
  Filled 2015-09-12 (×2): qty 2

## 2015-09-12 MED ORDER — HYDROCHLOROTHIAZIDE 12.5 MG PO CAPS
12.5000 mg | ORAL_CAPSULE | Freq: Every day | ORAL | Status: DC
  Administered 2015-09-13 – 2015-09-14 (×2): 12.5 mg via ORAL
  Filled 2015-09-12 (×2): qty 1

## 2015-09-12 MED ORDER — HEPARIN (PORCINE) IN NACL 100-0.45 UNIT/ML-% IJ SOLN
950.0000 [IU]/h | INTRAMUSCULAR | Status: DC
  Administered 2015-09-12 – 2015-09-13 (×2): 950 [IU]/h via INTRAVENOUS
  Filled 2015-09-12 (×2): qty 250

## 2015-09-12 MED ORDER — ONDANSETRON HCL 4 MG/2ML IJ SOLN: 4.0000 mg | Freq: Four times a day (QID) | INTRAMUSCULAR | Status: DC | PRN

## 2015-09-12 MED ORDER — HEPARIN BOLUS VIA INFUSION
4000.0000 [IU] | Freq: Once | INTRAVENOUS | Status: AC
  Administered 2015-09-12: 4000 [IU] via INTRAVENOUS

## 2015-09-12 MED ORDER — SODIUM CHLORIDE 0.9 % IV SOLN
20.0000 mL | INTRAVENOUS | Status: DC
  Administered 2015-09-12: 20 mL via INTRAVENOUS

## 2015-09-12 MED ORDER — LISINOPRIL-HYDROCHLOROTHIAZIDE 20-12.5 MG PO TABS: 1.0000 | ORAL_TABLET | Freq: Every day | ORAL | Status: DC

## 2015-09-12 MED ORDER — ACETAMINOPHEN 325 MG PO TABS: 650.0000 mg | ORAL_TABLET | ORAL | Status: DC | PRN

## 2015-09-12 MED ORDER — METFORMIN HCL ER 500 MG PO TB24
500.0000 mg | ORAL_TABLET | Freq: Two times a day (BID) | ORAL | Status: DC
  Administered 2015-09-13: 500 mg via ORAL
  Filled 2015-09-12: qty 1

## 2015-09-12 MED ORDER — ATORVASTATIN CALCIUM 20 MG PO TABS: 20.0000 mg | ORAL_TABLET | Freq: Every day | ORAL | Status: DC
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
## 2015-09-12 NOTE — ED Notes (Signed)
Report given to Chales AbrahamsMary Ann RN on [address] .

## 2015-09-12 NOTE — ED Notes (Signed)
Patient states that he ate some tuna yesterday and he felt like he had some burning in his chest. Patient reports that he took a pepcid and it felt better. This am woke up with the same feeling and called 911 - he had an EKG which was normal and was told to come here

## 2015-09-12 NOTE — Progress Notes (Signed)
ANTICOAGULATION CONSULT NOTE - Initial Consult  Pharmacy Consult for heparin Indication: chest pain/ACS  Allergies  Allergen Reactions  . Bee Venom Anaphylaxis    Anaphylaxis     Patient Measurements: Height: 5\' 8"  (172.7 cm) Weight: 175 lb (79.379 kg) IBW/kg (Calculated) : 68.4 Heparin Dosing Weight: 79 kg  Vital Signs: Temp: 98.4 F (36.9 C) (12/03 1350) Temp Source: Oral (12/03 1350) BP: 124/80 mmHg (12/03 1600) Pulse Rate: 90 (12/03 1600)  Labs:  Recent Labs  09/12/15 1424  HGB 15.8  HCT 46.2  PLT 176  APTT 26  LABPROT 12.6  INR 0.92  CREATININE 0.85  TROPONINI <0.03    Estimated Creatinine Clearance: 109.5 mL/min (by C-G formula based on Cr of 0.85).  Assessment: 44 yo male [facility] with retrosternal chest burning. Stress test normal last December. Pre-hospital EKG with NSST lateral leads and initial trop neg.   PMH: DMII, HTN  AC: heparin for ACS. None PTA  Cards: Initial trop neg  Renal: SCr 0.85  Heme: H&H 15.8/46.2, Plt 176  Goal of Therapy:  Heparin level 0.3-0.7 units/ml Monitor platelets by anticoagulation protocol: Yes   Plan:  Heparin 4000 unit bolus Heparin drip of 950 units/hr Initial HL at 2300 Daily HL, CBC Monitor s/sx of bleeding F/U cardiology plans  Isaac BlissMichael Kainalu Heggs, PharmD, BCPS, [facility] Clinical Pharmacist Pager [phone] 09/12/2015 4:26 PM

## 2015-09-12 NOTE — ED Notes (Signed)
MD at bedside. 

## 2015-09-12 NOTE — ED Provider Notes (Signed)
4:26 PM Assumed care from Dr. Rosalia Hammersay, please see their note for full history, physical and decision making until this point. In brief this is a 44 y.o. year old male who presented to the ED tonight with Chest Pain     Concern for possible ACS with st changes from EMS ecg to our ECG and somewhat typical symptoms. [facility]    ECG from EMS:     Labs, studies and imaging reviewed by myself and considered in medical decision making if ordered. Imaging interpreted by radiology.  Labs Reviewed  BASIC METABOLIC PANEL - Abnormal; Notable for the following:    Sodium 134 (*)    Chloride 100 (*)    Glucose, Bld 144 (*)    All other components within normal limits  CBC - Abnormal; Notable for the following:    WBC 3.4 (*)    All other components within normal limits  HEPATIC FUNCTION PANEL - Abnormal; Notable for the following:    Indirect Bilirubin 1.0 (*)    All other components within normal limits  TROPONIN I  APTT  PROTIME-INR    DG Chest Portable 1 View  Final Result    DG Chest 2 View    (Results Pending)    No Follow-up on file.   Marily MemosJason Wataru Mccowen, MD 09/13/15 [phone]
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
## 2015-09-13 DIAGNOSIS — I10 Essential (primary) hypertension: Secondary | ICD-10-CM

## 2015-09-13 DIAGNOSIS — I200 Unstable angina: Secondary | ICD-10-CM | POA: Diagnosis not present

## 2015-09-13 DIAGNOSIS — E785 Hyperlipidemia, unspecified: Secondary | ICD-10-CM | POA: Diagnosis present

## 2015-09-13 DIAGNOSIS — E119 Type 2 diabetes mellitus without complications: Secondary | ICD-10-CM | POA: Insufficient documentation

## 2015-09-13 LAB — TROPONIN I
Troponin I: <0.03 ng/mL (ref <0.031)
Troponin I: <0.03 ng/mL (ref <0.031)
Troponin I: <0.03 ng/mL (ref <0.031)

## 2015-09-13 LAB — GLUCOSE, CAPILLARY
GLUCOSE-CAPILLARY: 110 mg/dL — AB (ref 65–99)
GLUCOSE-CAPILLARY: 125 mg/dL — AB (ref 65–99)
Glucose-Capillary: 121 mg/dL — ABNORMAL HIGH (ref 65–99)
Glucose-Capillary: 104 mg/dL — ABNORMAL HIGH (ref 65–99)

## 2015-09-13 LAB — HEPARIN LEVEL (UNFRACTIONATED)
HEPARIN UNFRACTIONATED: 0.65 [IU]/mL (ref 0.30–0.70)
Heparin Unfractionated: 0.56 IU/mL (ref 0.30–0.70)

## 2015-09-13 MED ORDER — ALPRAZOLAM 0.25 MG PO TABS
0.2500 mg | ORAL_TABLET | Freq: Once | ORAL | Status: AC
  Administered 2015-09-13: 0.25 mg via ORAL
  Filled 2015-09-13: qty 1

## 2015-09-13 MED ORDER — ASPIRIN 81 MG PO CHEW
81.0000 mg | CHEWABLE_TABLET | ORAL | Status: AC
  Administered 2015-09-14: 81 mg via ORAL
  Filled 2015-09-13: qty 1

## 2015-09-13 MED ORDER — SODIUM CHLORIDE 0.9 % WEIGHT BASED INFUSION
1.0000 mL/kg/h | INTRAVENOUS | Status: DC
  Administered 2015-09-14: 1 mL/kg/h via INTRAVENOUS
  Administered 2015-09-14: 500 mL/h via INTRAVENOUS

## 2015-09-13 MED ORDER — SODIUM CHLORIDE 0.9 % WEIGHT BASED INFUSION
3.0000 mL/kg/h | INTRAVENOUS | Status: DC
  Administered 2015-09-14: 3 mL/kg/h via INTRAVENOUS

## 2015-09-13 MED ORDER — ASPIRIN EC 81 MG PO TBEC
81.0000 mg | DELAYED_RELEASE_TABLET | Freq: Every day | ORAL | Status: DC
  Administered 2015-09-13: 81 mg via ORAL
  Filled 2015-09-13: qty 1

## 2015-09-13 MED ORDER — INSULIN ASPART 100 UNIT/ML ~~LOC~~ SOLN
0.0000 [IU] | Freq: Three times a day (TID) | SUBCUTANEOUS | Status: DC
  Administered 2015-09-14: 2 [IU] via SUBCUTANEOUS

## 2015-09-13 NOTE — Progress Notes (Signed)
  Patient: Darren Salazar / Admit Date: 09/12/2015 / Date of Encounter: 09/13/2015, 9:13 AM   Subjective: No further chest pain. Just anxious.   Objective: Telemetry: NSR/brief sinus tach Physical Exam: Blood pressure 132/67, pulse 89, temperature 98.7 F (37.1 C), temperature source Oral, resp. rate 20, height 5\' 9"  (1.753 m), weight 175 lb (79.379 kg), SpO2 100 %. General: Well developed, well nourished AAM in no acute distress. Head: Normocephalic, atraumatic, sclera non-icteric, no xanthomas, nares are without discharge. Neck: Negative for carotid bruits. JVP not elevated. Lungs: Clear bilaterally to auscultation without wheezes, rales, or rhonchi. Breathing is unlabored. Heart: RRR S1 S2 without murmurs, rubs, or gallops.  Abdomen: Soft, non-tender, non-distended with normoactive bowel sounds. No rebound/guarding. Extremities: No clubbing or cyanosis. No edema. Distal pedal pulses are 2+ and equal bilaterally. Neuro: Alert and oriented X 3. Moves all extremities spontaneously. Psych:  Responds to questions appropriately with a normal affect.   Intake/Output Summary (Last 24 hours) at 09/13/15 0913 Last data filed at 09/13/15 0900  Gross per 24 hour  Intake  749.5 ml  Output      0 ml  Net  749.5 ml    Inpatient Medications:  . aspirin EC  81 mg Oral Daily  . atorvastatin  20 mg Oral q1800  . lisinopril  20 mg Oral Daily   And  . hydrochlorothiazide  12.5 mg Oral Daily  . insulin aspart  0-9 Units Subcutaneous TID WC   Infusions:  . sodium chloride Stopped (09/12/15 1623)  . heparin 950 Units/hr (09/12/15 1644)    Labs:  Recent Labs  09/12/15 1424  NA 134*  K 3.5  CL 100*  CO2 25  GLUCOSE 144*  BUN 13  CREATININE 0.85  CALCIUM 9.6    Recent Labs  09/12/15 1424  AST 26  ALT 24  ALKPHOS 48  BILITOT 1.2  PROT 8.0  ALBUMIN 4.7    Recent Labs  09/12/15 1424  WBC 3.4*  HGB 15.8  HCT 46.2  MCV 87.8  PLT 176    Recent Labs  09/12/15 1424  09/12/15 2306 09/13/15 0150 09/13/15 0500  TROPONINI <0.03 <0.03 <0.03 <0.03   Radiology/Studies:  Dg Chest Portable 1 View  09/12/2015  CLINICAL DATA:  Chest pain EXAM: PORTABLE CHEST 1 VIEW COMPARISON:  04/10/2015 chest radiograph. FINDINGS: Stable cardiomediastinal silhouette with normal heart size. No pneumothorax. No pleural effusion. Clear lungs, with no focal lung consolidation and no pulmonary edema. IMPRESSION: No active disease. Electronically Signed   By: Delbert PhenixJason A Poff M.D.   On: 09/12/2015 14:56     Assessment and Plan   1. Chest pain concerning for unstable angina - plan cath tomorrow. Risks/benefits already discussed. Continue heparin per pharmacy. Will add daily scheduled aspirin. I wrote cath orders and will add to the add-on board. I have asked the patient to notify us if he has any recurrent discomfort. Will review with MD whether they want to start low dose BB.  2. Diabetes mellitus - will hold metformin and add SSI.  3. Dysplipidemia - check lipid panel in AM. If poorly controlled would escalate Lipitor further.  4. HTN - continue present regimen.  Signed, Ronie Spiesayna Dunn PA-C Pager: [phone]   I have seen, examined the patient, and reviewed the above assessment and plan.  On exam, comfortable appearing.  Currently chest pain free.  Changes to above are made where necessary.   Cath in am.  Co Sign: Hillis RangeJames Soliana Kitko, MD 09/13/2015 10:02 AM

## 2015-09-13 NOTE — Progress Notes (Signed)
ANTICOAGULATION CONSULT NOTE - Follow-up Consult  Pharmacy Consult for heparin Indication: chest pain/ACS  Allergies  Allergen Reactions  . Bee Venom Anaphylaxis    Anaphylaxis     Patient Measurements: Height: 5\' 9"  (175.3 cm) Weight: 175 lb (79.379 kg) IBW/kg (Calculated) : 70.7 Heparin Dosing Weight: 79 kg  Vital Signs: Temp: 99 F (37.2 C) (12/03 2018) Temp Source: Oral (12/03 2018) BP: 143/85 mmHg (12/03 2018) Pulse Rate: 77 (12/03 2018)  Labs:  Recent Labs  09/12/15 1424 09/12/15 2306 09/13/15 0154  HGB 15.8  --   --   HCT 46.2  --   --   PLT 176  --   --   APTT 26  --   --   LABPROT 12.6  --   --   INR 0.92  --   --   HEPARINUNFRC  --   --  0.56  CREATININE 0.85  --   --   TROPONINI <0.03 <0.03  --     Estimated Creatinine Clearance: 113.2 mL/min (by C-G formula based on Cr of 0.85).  Assessment: 44 yo male on heparin for r/o ACS. Heparin level 0.56 (therapeutic) on 950 units/hr. No bleeding noted.  Goal of Therapy:  Heparin level 0.3-0.7 units/ml Monitor platelets by anticoagulation protocol: Yes   Plan:  Continue heparin drip 950 units/hr Daily HL, CBC  Christoper Fabianaron Henry Demeritt, PharmD, BCPS Clinical pharmacist, pager [phone] 09/13/2015 2:26 AM

## 2015-09-13 NOTE — Progress Notes (Signed)
PHARMACY NOTE - Follow Up Consult  Pharmacy Consult  :  Heparin Indication  :  Chest pain/ACS  Heparin Dosing Weight: 79 kg   Recent Labs  09/12/15 1424 09/13/15 0154 09/13/15 0745  HGB 15.8  --   --   HCT 46.2  --   --   PLT 176  --   --   APTT 26  --   --   LABPROT 12.6  --   --   INR 0.92  --   --   HEPARINUNFRC  --  0.56 0.65  CREATININE 0.85  --   --    Medications: Scheduled:  . [START ON 09/14/2015] aspirin  81 mg Oral Pre-Cath  . aspirin EC  81 mg Oral Daily  . atorvastatin  20 mg Oral q1800  . lisinopril  20 mg Oral Daily   And  . hydrochlorothiazide  12.5 mg Oral Daily  . insulin aspart  0-9 Units Subcutaneous TID WC    Infusions:  . sodium chloride Stopped (09/12/15 1623)  . [START ON 09/14/2015] sodium chloride     Followed by  . [START ON 09/14/2015] sodium chloride    . heparin 950 Units/hr (09/12/15 1644)   Assessment:  44 y/o male on Heparin infusion for chest pain/rule out ACS.  Heparin rate 950 units/hr, Heparin level within therapeutic range 0.65 units/ml No evidence of bleeding complications noted   Goal:  Heparin level 0.3-0.7 units/ml   Plan: 1. Continue Heparin at 950 units/hr. 2. Daily Heparin Levels, Platelet counts, CBC.  Monitor for bleeding complications  3. Follow up Cath Monday.  Tabathia Knoche, Elisha HeadlandEarle J,  Pharm.D  09/13/2015, 10:55 AM

## 2015-09-14 ENCOUNTER — Encounter (HOSPITAL_COMMUNITY)
Admission: EM | Disposition: A | Discharge status: Home / Self Care | Payer: Self-pay | Source: Home / Self Care | Attending: Cardiology

## 2015-09-14 DIAGNOSIS — E785 Hyperlipidemia, unspecified: Secondary | ICD-10-CM | POA: Diagnosis not present

## 2015-09-14 DIAGNOSIS — Z0389 Encounter for observation for other suspected diseases and conditions ruled out: Secondary | ICD-10-CM | POA: Diagnosis not present

## 2015-09-14 DIAGNOSIS — R0789 Other chest pain: Secondary | ICD-10-CM

## 2015-09-14 DIAGNOSIS — I10 Essential (primary) hypertension: Secondary | ICD-10-CM | POA: Diagnosis not present

## 2015-09-14 DIAGNOSIS — I25110 Atherosclerotic heart disease of native coronary artery with unstable angina pectoris: Secondary | ICD-10-CM | POA: Diagnosis not present

## 2015-09-14 DIAGNOSIS — E119 Type 2 diabetes mellitus without complications: Secondary | ICD-10-CM | POA: Diagnosis not present

## 2015-09-14 HISTORY — PX: CARDIAC CATHETERIZATION: SHX172

## 2015-09-14 LAB — BASIC METABOLIC PANEL
ANION GAP: 10 (ref 5–15)
BUN: 16 mg/dL (ref 6–20)
CALCIUM: 9.7 mg/dL (ref 8.9–10.3)
CO2: 27 mmol/L (ref 22–32)
CREATININE: 1.16 mg/dL (ref 0.61–1.24)
Chloride: 98 mmol/L — ABNORMAL LOW (ref 101–111)
GFR calc Af Amer: >60 mL/min (ref >60)
GLUCOSE: 120 mg/dL — AB (ref 65–99)
Potassium: 4.1 mmol/L (ref 3.5–5.1)
Sodium: 135 mmol/L (ref 135–145)

## 2015-09-14 LAB — LIPID PANEL
Cholesterol: 152 mg/dL (ref 0–200)
HDL: 59 mg/dL (ref >40)
LDL CALC: 89 mg/dL (ref 0–99)
Total CHOL/HDL Ratio: 2.6 RATIO
Triglycerides: 21 mg/dL (ref <150)
VLDL: 4 mg/dL (ref 0–40)

## 2015-09-14 LAB — GLUCOSE, CAPILLARY
GLUCOSE-CAPILLARY: 118 mg/dL — AB (ref 65–99)
GLUCOSE-CAPILLARY: 89 mg/dL (ref 65–99)
GLUCOSE-CAPILLARY: 164 mg/dL — AB (ref 65–99)

## 2015-09-14 LAB — CBC
HCT: 46.1 % (ref 39.0–52.0)
Hemoglobin: 15.8 g/dL (ref 13.0–17.0)
MCH: 30.7 pg (ref 26.0–34.0)
MCHC: 34.3 g/dL (ref 30.0–36.0)
MCV: 89.7 fL (ref 78.0–100.0)
PLATELETS: 198 10*3/uL (ref 150–400)
RBC: 5.14 MIL/uL (ref 4.22–5.81)
RDW: 12.5 % (ref 11.5–15.5)
WBC: 5.3 10*3/uL (ref 4.0–10.5)

## 2015-09-14 LAB — D-DIMER, QUANTITATIVE: D-Dimer, Quant: <0.27 ug/mL-FEU (ref 0.00–0.50)

## 2015-09-14 LAB — HEPARIN LEVEL (UNFRACTIONATED): HEPARIN UNFRACTIONATED: 0.48 [IU]/mL (ref 0.30–0.70)

## 2015-09-14 SURGERY — LEFT HEART CATH AND CORONARY ANGIOGRAPHY
Anesthesia: LOCAL

## 2015-09-14 MED ORDER — HEPARIN (PORCINE) IN NACL 2-0.9 UNIT/ML-% IJ SOLN
INTRAMUSCULAR | Status: AC
  Filled 2015-09-14: qty 1000

## 2015-09-14 MED ORDER — SODIUM CHLORIDE 0.9 % IJ SOLN: 3.0000 mL | Freq: Two times a day (BID) | INTRAMUSCULAR | Status: DC

## 2015-09-14 MED ORDER — IOHEXOL 350 MG/ML SOLN
INTRAVENOUS | Status: DC | PRN
  Administered 2015-09-14: 60 mL via INTRA_ARTERIAL

## 2015-09-14 MED ORDER — VERAPAMIL HCL 2.5 MG/ML IV SOLN
INTRA_ARTERIAL | Status: DC | PRN
  Administered 2015-09-14: 13:00:00 via INTRA_ARTERIAL

## 2015-09-14 MED ORDER — NITROGLYCERIN 1 MG/10 ML FOR IR/CATH LAB
INTRA_ARTERIAL | Status: AC
  Filled 2015-09-14: qty 10

## 2015-09-14 MED ORDER — METOPROLOL TARTRATE 1 MG/ML IV SOLN
INTRAVENOUS | Status: AC
Start: 2015-09-14 — End: 2015-09-14
  Filled 2015-09-14: qty 5

## 2015-09-14 MED ORDER — FENTANYL CITRATE (PF) 100 MCG/2ML IJ SOLN
INTRAMUSCULAR | Status: DC | PRN
  Administered 2015-09-14: 50 ug via INTRAVENOUS

## 2015-09-14 MED ORDER — MIDAZOLAM HCL 2 MG/2ML IJ SOLN
INTRAMUSCULAR | Status: AC
  Filled 2015-09-14: qty 2

## 2015-09-14 MED ORDER — SODIUM CHLORIDE 0.9 % IV SOLN: 250.0000 mL | INTRAVENOUS | Status: DC | PRN

## 2015-09-14 MED ORDER — SODIUM CHLORIDE 0.9 % IJ SOLN: 3.0000 mL | INTRAMUSCULAR | Status: DC | PRN

## 2015-09-14 MED ORDER — VERAPAMIL HCL 2.5 MG/ML IV SOLN
INTRAVENOUS | Status: AC
  Filled 2015-09-14: qty 2

## 2015-09-14 MED ORDER — METOPROLOL TARTRATE 1 MG/ML IV SOLN
INTRAVENOUS | Status: DC | PRN
  Administered 2015-09-14: 5 mg via INTRAVENOUS

## 2015-09-14 MED ORDER — LIDOCAINE HCL (PF) 1 % IJ SOLN
INTRAMUSCULAR | Status: AC
  Filled 2015-09-14: qty 30

## 2015-09-14 MED ORDER — ATORVASTATIN CALCIUM 40 MG PO TABS
40.0000 mg | ORAL_TABLET | Freq: Every day | ORAL | Status: DC
  Administered 2015-09-14: 40 mg via ORAL
  Filled 2015-09-14: qty 1

## 2015-09-14 MED ORDER — SODIUM CHLORIDE 0.9 % WEIGHT BASED INFUSION
3.0000 mL/kg/h | INTRAVENOUS | Status: AC
  Administered 2015-09-14: 3 mL/kg/h via INTRAVENOUS

## 2015-09-14 MED ORDER — ALPRAZOLAM 0.5 MG PO TABS
0.5000 mg | ORAL_TABLET | ORAL | Status: AC
  Administered 2015-09-14: 0.5 mg via ORAL
  Filled 2015-09-14: qty 1

## 2015-09-14 MED ORDER — HEPARIN SODIUM (PORCINE) 1000 UNIT/ML IJ SOLN
INTRAMUSCULAR | Status: DC | PRN
  Administered 2015-09-14: 4000 [IU] via INTRAVENOUS

## 2015-09-14 MED ORDER — MIDAZOLAM HCL 2 MG/2ML IJ SOLN
INTRAMUSCULAR | Status: DC | PRN
  Administered 2015-09-14: 1 mg via INTRAVENOUS
  Administered 2015-09-14: 2 mg via INTRAVENOUS

## 2015-09-14 MED ORDER — HEPARIN SODIUM (PORCINE) 1000 UNIT/ML IJ SOLN
INTRAMUSCULAR | Status: AC
Start: 2015-09-14 — End: 2015-09-14
  Filled 2015-09-14: qty 1

## 2015-09-14 MED ORDER — FENTANYL CITRATE (PF) 100 MCG/2ML IJ SOLN
INTRAMUSCULAR | Status: AC
  Filled 2015-09-14: qty 2

## 2015-09-14 MED ORDER — NITROGLYCERIN 1 MG/10 ML FOR IR/CATH LAB
INTRA_ARTERIAL | Status: DC | PRN
  Administered 2015-09-14: 13:00:00

## 2015-09-14 MED ORDER — METFORMIN HCL ER 500 MG PO TB24: 500.0000 mg | ORAL_TABLET | Freq: Two times a day (BID) | ORAL | Status: DC

## 2015-09-14 SURGICAL SUPPLY — 13 items
CATH INFINITI 5FR ANG PIGTAIL (CATHETERS) ×2 IMPLANT
CATH OPTITORQUE TIG 4.0 5F (CATHETERS) ×2 IMPLANT
COVER PRB 48X5XTLSCP FOLD TPE (BAG) ×1 IMPLANT
COVER PROBE 5X48 (BAG) ×1
DEVICE RAD COMP TR BAND LRG (VASCULAR PRODUCTS) ×2 IMPLANT
GLIDESHEATH SLEND A-KIT 6F 22G (SHEATH) ×2 IMPLANT
KIT HEART LEFT (KITS) ×2 IMPLANT
NEEDLE PERC ENTRY 21G 2.5CM (NEEDLE) ×2 IMPLANT
PACK CARDIAC CATHETERIZATION (CUSTOM PROCEDURE TRAY) ×2 IMPLANT
TRANSDUCER W/STOPCOCK (MISCELLANEOUS) ×2 IMPLANT
TUBING CIL FLEX 10 FLL-RA (TUBING) ×2 IMPLANT
WIRE MICROINTRODUCER 60CM (WIRE) ×2 IMPLANT
WIRE SAFE-T 1.5MM-J .035X260CM (WIRE) ×2 IMPLANT

## 2015-09-14 NOTE — Progress Notes (Signed)
PHARMACY NOTE - Follow Up Consult  Pharmacy Consult  :  Heparin Indication  :  Chest pain/ACS  Heparin Dosing Weight: 79 kg   Recent Labs  09/12/15 1424 09/13/15 0154 09/13/15 0745 09/14/15 0227  HGB 15.8  --   --  15.8  HCT 46.2  --   --  46.1  PLT 176  --   --  198  APTT 26  --   --   --   LABPROT 12.6  --   --   --   INR 0.92  --   --   --   HEPARINUNFRC  --  0.56 0.65 0.48  CREATININE 0.85  --   --  1.16   Medications:  Scheduled:  . aspirin  81 mg Oral Pre-Cath  . aspirin EC  81 mg Oral Daily  . atorvastatin  40 mg Oral q1800  . lisinopril  20 mg Oral Daily   And  . hydrochlorothiazide  12.5 mg Oral Daily  . insulin aspart  0-9 Units Subcutaneous TID WC    Infusions:  . sodium chloride Stopped (09/12/15 1623)  . sodium chloride 1 mL/kg/hr (09/14/15 0700)  . heparin 950 Units/hr (09/13/15 1504)   Assessment: 44 y/o male on Heparin infusion for chest pain/rule out ACS. Heparin is at 950 units/hr and heparin level within therapeutic range (HL= 0.48). Patient noted for cath today.  Goal:  Heparin level 0.3-0.7 units/ml   Plan: -No heparin changes needed -Will follow plans post cath  Harland Germanndrew Jenai Scaletta, Pharm D 09/14/2015 8:59 AM

## 2015-09-14 NOTE — Discharge Summary (Signed)
  Physician Discharge Summary  Patient ID: Darren DaftLarry T Simmon MRN: [id number] DOB/AGE: 42/03/1971 42 y.o.   Primary Cardiologist: Dr. Delton SeeNelson  Admit date: 09/12/2015 Discharge date: 09/14/2015  Admission Diagnoses: Unstable Angina  Discharge Diagnoses:  Active Problems:   Essential hypertension   Diabetes mellitus type 2, controlled (HCC)   Unstable angina (HCC)   Dyslipidemia   Discharged Condition: stable  Hospital Course: 44 y/o male with multiple cardiac risk factors including HTN, DM and family h/o heart disease who [facility] with new onset resting chest pain concerning for unstable angina. Symptoms were relieved with NTG. Pretest probability for obstructive CAD was felt to be moderate to high. He was admitted for further observation. Cardiac enzymes were cycled and negative x 3. [facility]. Procedure was performed by Dr. Herbie BaltimoreHarding on 09/14/15. He was found to have normal coronaries and normal LVF. Continued risk factor modification for primary prevention of CAD was recommended. He left the cath lab in stable condition. Given sinus tachycardia, a d-dimer was checked to r/o possibility of PE. D-dimer was negative. He was monitored post cath and had no complications. He was cleared for discharge by Dr. Rennis GoldenHilty. He will f/u with an extender for post cath f/u. He will continue f/u with Dr. Delton SeeNelson.  He was instructed to hold metformin for 48 hrs.   Consults: None  Significant Diagnostic Studies:  [facility] 09/14/15 Normal coronaries. See [facility] Cath note in Epic.   Treatments: See Hospital Course  Discharge Exam: Blood pressure 111/69, pulse 85, temperature 98.4 F (36.9 C), temperature source Oral, resp. rate 18, height 5\' 9"  (1.753 m), weight 175 lb (79.379 kg), SpO2 98 %.   Disposition: 01-Home or Self Care      Discharge Instructions    Diet - low sodium heart healthy    Complete by:  As directed      Increase activity slowly     Complete by:  As directed             Medication List    TAKE these medications        atorvastatin 20 MG tablet  Commonly known as:  LIPITOR  TAKE 1 TABLET BY MOUTH ONE TIME DAILY.     EPIPEN 2-PAK 0.3 mg/0.3 mL Soaj injection  Generic drug:  EPINEPHrine  INJECT INTRAMUSCULARLY AS NEEDED FOR ALLERGIC REACTION AS DIRECTED BY PRESCRIBER     hydrocortisone 25 MG suppository  Commonly known as:  ANUSOL-HC  Place 1 suppository (25 mg total) rectally at bedtime as needed for hemorrhoids or itching.     lisinopril-hydrochlorothiazide 20-12.5 MG tablet  Commonly known as:  PRINZIDE,ZESTORETIC  Take 1 tablet by mouth daily.     metFORMIN 500 MG 24 hr tablet  Commonly known as:  GLUCOPHAGE-XR  Take 1 tablet (500 mg total) by mouth 2 (two) times daily.  Start taking on:  09/16/2015     multivitamin tablet  Take 1 tablet by mouth daily.       Follow-up Information    Follow up with Lars MassonNELSON, KATARINA H, MD.   Specialty:  Cardiology   Why:  our office will call you with a hospital follow-up appointment    Contact information:   [address] [place] [id number] [phone]       TIME SPENT ON DISCHARGE, INCLUDING PHYSICIAN TIME: > 30 MINUTES  Signed: SIMMONS, BRITTAINY 09/14/2015, 4:52 PM

## 2015-09-14 NOTE — Interval H&P Note (Signed)
History and Physical Interval Note:  09/14/2015 11:59 AM  Darren Salazar  has presented today for surgery, with the diagnosis of unstable angina.    The various methods of treatment have been discussed with the patient and family. After consideration of risks, benefits and other options for treatment, the patient has consented to  Procedure(s): Left Heart Cath and Coronary Angiography (N/A) with possible Percutaneous Coronary Intervention as a surgical intervention .     The patient's history has been reviewed, patient examined, no change in status, stable for surgery.  I have reviewed the patient's chart and labs.  Questions were answered to the patient's satisfaction.    Cath Lab Visit (complete for each Cath Lab visit)  Clinical Evaluation Leading to the Procedure:   ACS: Yes.    Non-ACS:    Anginal Classification: CCS IV  Anti-ischemic medical therapy: Minimal Therapy (1 class of medications)  Non-Invasive Test Results: No non-invasive testing performed  Prior CABG: No previous CABG   TIMI SCORE  Patient Information:  TIMI Score is 3  UA/NSTEMI and intermediate-risk features (e.g., TIMI score 3?4) for short-term risk of death or nonfatal MI  Revascularization of the presumed culprit artery   A (9)  Indication: 10; Score: 9   HARDING, DAVID W

## 2015-09-14 NOTE — Progress Notes (Signed)
09/14/2015 7:26 PM Discharge AVS meds taken today and those due this evening reviewed.  Follow-up appointments and when to call md reviewed.  D/C IV and TELE.  Questions and concerns addressed.   D/C home per orders. Kathryne HitchAllen, Ulmer Degen C

## 2015-09-14 NOTE — Brief Op Note (Signed)
  BRIEF  CARDIAC CATH NOTE  09/12/2015 - 09/14/2015  1:02 PM  PATIENT:  Darren Salazar  44 y.o. male  with symptoms concerning for unstable angina. He has diabetes on metformin as well as hypertension and dyslipidemia.  He is referred for cardiac catheterization.  SURGEON:  Surgeon(s) and Role:    * Marykay Lexavid W Harding, MD - Primary PRE-OPERATIVE DIAGNOSIS:  unstable angina  POST-OPERATIVE DIAGNOSIS: Angiographic normal coronary arteries with a codominant system. Sinus tachycardia, therefore LV gram was not performed.  PROCEDURE:  Procedure(s): Left Heart Cath and Coronary Angiography (N/A)   Ultrasound-guided 6 French glide sheath placed in the right radial artery using Seldinger technique. Radial Cocktail: 5 mg Verapamil, 400 mcg NTG, 2 ml 2% Lidocaine in 10 ml NS 4000 Units IV Heparin  5 TIG 4.0 Catheter -- R&L Coronary Angiography & LV Hemodynamics   ANESTHESIA:   local and IV sedation 3 mg Versed 50 mcg IV Fentanyl  MEDICATIONS USED:  SQ LIDOCAINE  Radial Cocktail: 5 mg Verapamil, 400 mcg NTG, 2 ml 2% Lidocaine in 10 ml NS IV Metoprolol 5 mg 500 mL NS Bolus  EBL:    < 50 mL  TOURNIQUET:  TR Band 9 mL air @ 1250 PM  DICTATION: .Note written in EPIC  PLAN OF CARE: Return to RN Unit - consider checking for PE; Rx Sinus Tachy  PATIENT DISPOSITION:  PACU - hemodynamically stable.   Delay start of Pharmacological VTE agent (>24hrs) due to surgical blood loss or risk of bleeding: not applicable  - hold IV heparin x 6 hr post TR Band.  Marykay LexHARDING, DAVID W, M.D., M.S. Interventional Cardiologist   Pager # [phone]

## 2015-09-14 NOTE — Progress Notes (Signed)
  Patient: Darren Salazar / Admit Date: 09/12/2015 / Date of Encounter: 09/14/2015, 8:41 AM   Subjective: No further chest pain. Just anxious.   Objective: Telemetry: NSR/brief sinus tach Physical Exam: Blood pressure 128/81, pulse 89, temperature 98.4 F (36.9 C), temperature source Oral, resp. rate 12, height 5\' 9"  (1.753 m), weight 175 lb (79.379 kg), SpO2 99 %. General: Well developed, well nourished AAM in no acute distress. Head: Normocephalic, atraumatic, sclera non-icteric, no xanthomas, nares are without discharge. Neck: Negative for carotid bruits. JVP not elevated. Lungs: Clear bilaterally to auscultation without wheezes, rales, or rhonchi. Breathing is unlabored. Heart: RRR S1 S2 without murmurs, rubs, or gallops.  Abdomen: Soft, non-tender, non-distended with normoactive bowel sounds. No rebound/guarding. Extremities: No clubbing or cyanosis. No edema. Distal pedal pulses are 2+ and equal bilaterally. Neuro: Alert and oriented X 3. Moves all extremities spontaneously. Psych:  Responds to questions appropriately with a normal affect.   Intake/Output Summary (Last 24 hours) at 09/14/15 0841 Last data filed at 09/14/15 0444  Gross per 24 hour  Intake    600 ml  Output    200 ml  Net    400 ml    Inpatient Medications:  . ALPRAZolam  0.5 mg Oral NOW  . aspirin  81 mg Oral Pre-Cath  . aspirin EC  81 mg Oral Daily  . atorvastatin  20 mg Oral q1800  . lisinopril  20 mg Oral Daily   And  . hydrochlorothiazide  12.5 mg Oral Daily  . insulin aspart  0-9 Units Subcutaneous TID WC   Infusions:  . sodium chloride Stopped (09/12/15 1623)  . sodium chloride 1 mL/kg/hr (09/14/15 0700)  . heparin 950 Units/hr (09/13/15 1504)    Labs:  Recent Labs  09/12/15 1424 09/14/15 0227  NA 134* 135  K 3.5 4.1  CL 100* 98*  CO2 25 27  GLUCOSE 144* 120*  BUN 13 16  CREATININE 0.85 1.16  CALCIUM 9.6 9.7    Recent Labs  09/12/15 1424  AST 26  ALT 24  ALKPHOS 48  BILITOT  1.2  PROT 8.0  ALBUMIN 4.7    Recent Labs  09/12/15 1424 09/14/15 0227  WBC 3.4* 5.3  HGB 15.8 15.8  HCT 46.2 46.1  MCV 87.8 89.7  PLT 176 198    Recent Labs  09/12/15 1424 09/12/15 2306 09/13/15 0150 09/13/15 0500  TROPONINI <0.03 <0.03 <0.03 <0.03   Radiology/Studies:  Dg Chest Portable 1 View  09/12/2015  CLINICAL DATA:  Chest pain EXAM: PORTABLE CHEST 1 VIEW COMPARISON:  04/10/2015 chest radiograph. FINDINGS: Stable cardiomediastinal silhouette with normal heart size. No pneumothorax. No pleural effusion. Clear lungs, with no focal lung consolidation and no pulmonary edema. IMPRESSION: No active disease. Electronically Signed   By: Delbert PhenixJason A Poff M.D.   On: 09/12/2015 14:56     Assessment and Plan   1. Chest pain concerning for unstable angina - plan cath today. Risks/benefits already discussed. Continue heparin per pharmacy.   2. Diabetes mellitus - will hold metformin and add SSI.  3. Dysplipidemia - TC 152, LDL 89 - on Lipitor 20 mg daily at home, increase to 40 mg QHS.  4. HTN - continue present regimen.  5. Anxiety - will Rx for 0.5 mg Xanax now prior to cath.  Chrystie NoseKenneth C. Hilty, MD, [facility]ACC Attending Cardiologist [facility]  Lisette AbuKenneth C [facility] 09/14/2015 8:41 AM

## 2015-09-14 NOTE — Progress Notes (Signed)
09/14/2015 1310 Received back to room from cath lab.  Pt had a R radial cath.  Pt is A&O, no c/o voiced.  Tele monitor applied, CCMD notified.  Oriented to room, call light and bed.  Call bell in reach. Darren Salazar, Elisabetta Mishra C

## 2015-09-14 NOTE — Progress Notes (Signed)
Cath results noted - angiographically normal coronaries. PE considered as a possibility - no hypoxia, but has been tachycardic today. May be related to anxiety, as it improved after sedation. Will check STAT d-dimer. If negative, d/c anticoagulation and d/c home today.  Chrystie NoseKenneth C. Nassim Cosma, MD, [facility] Center[facility]ACC Attending Cardiologist [facility]

## 2015-09-14 NOTE — H&P (View-Only) (Signed)
  Patient: Darren Salazar / Admit Date: 09/12/2015 / Date of Encounter: 09/14/2015, 8:41 AM   Subjective: No further chest pain. Just anxious.   Objective: Telemetry: NSR/brief sinus tach Physical Exam: Blood pressure 128/81, pulse 89, temperature 98.4 F (36.9 C), temperature source Oral, resp. rate 12, height 5' 9" (1.753 m), weight 175 lb (79.379 kg), SpO2 99 %. General: Well developed, well nourished AAM in no acute distress. Head: Normocephalic, atraumatic, sclera non-icteric, no xanthomas, nares are without discharge. Neck: Negative for carotid bruits. JVP not elevated. Lungs: Clear bilaterally to auscultation without wheezes, rales, or rhonchi. Breathing is unlabored. Heart: RRR S1 S2 without murmurs, rubs, or gallops.  Abdomen: Soft, non-tender, non-distended with normoactive bowel sounds. No rebound/guarding. Extremities: No clubbing or cyanosis. No edema. Distal pedal pulses are 2+ and equal bilaterally. Neuro: Alert and oriented X 3. Moves all extremities spontaneously. Psych:  Responds to questions appropriately with a normal affect.   Intake/Output Summary (Last 24 hours) at 09/14/15 0841 Last data filed at 09/14/15 0444  Gross per 24 hour  Intake    600 ml  Output    200 ml  Net    400 ml    Inpatient Medications:  . ALPRAZolam  0.5 mg Oral NOW  . aspirin  81 mg Oral Pre-Cath  . aspirin EC  81 mg Oral Daily  . atorvastatin  20 mg Oral q1800  . lisinopril  20 mg Oral Daily   And  . hydrochlorothiazide  12.5 mg Oral Daily  . insulin aspart  0-9 Units Subcutaneous TID WC   Infusions:  . sodium chloride Stopped (09/12/15 1623)  . sodium chloride 1 mL/kg/hr (09/14/15 0700)  . heparin 950 Units/hr (09/13/15 1504)    Labs:  Recent Labs  09/12/15 1424 09/14/15 0227  NA 134* 135  K 3.5 4.1  CL 100* 98*  CO2 25 27  GLUCOSE 144* 120*  BUN 13 16  CREATININE 0.85 1.16  CALCIUM 9.6 9.7    Recent Labs  09/12/15 1424  AST 26  ALT 24  ALKPHOS 48  BILITOT  1.2  PROT 8.0  ALBUMIN 4.7    Recent Labs  09/12/15 1424 09/14/15 0227  WBC 3.4* 5.3  HGB 15.8 15.8  HCT 46.2 46.1  MCV 87.8 89.7  PLT 176 198    Recent Labs  09/12/15 1424 09/12/15 2306 09/13/15 0150 09/13/15 0500  TROPONINI <0.03 <0.03 <0.03 <0.03   Radiology/Studies:  Dg Chest Portable 1 View  09/12/2015  CLINICAL DATA:  Chest pain EXAM: PORTABLE CHEST 1 VIEW COMPARISON:  04/10/2015 chest radiograph. FINDINGS: Stable cardiomediastinal silhouette with normal heart size. No pneumothorax. No pleural effusion. Clear lungs, with no focal lung consolidation and no pulmonary edema. IMPRESSION: No active disease. Electronically Signed   By: Jason A Poff M.D.   On: 09/12/2015 14:56     Assessment and Plan   1. Chest pain concerning for unstable angina - plan cath today. Risks/benefits already discussed. Continue heparin per pharmacy.   2. Diabetes mellitus - will hold metformin and add SSI.  3. Dysplipidemia - TC 152, LDL 89 - on Lipitor 20 mg daily at home, increase to 40 mg QHS.  4. HTN - continue present regimen.  5. Anxiety - will Rx for 0.5 mg Xanax now prior to cath.  Amara Justen C. Toyia Jelinek, MD, FACC Attending Cardiologist [facility]  Kyran Whittier C Rakeb Kibble 09/14/2015 8:41 AM   

## 2015-09-14 NOTE — Research (Signed)
CADLAD Informed Consent   Subject Name: Darren Salazar  Subject met inclusion and exclusion criteria.  The informed consent form, study requirements and expectations were reviewed with the subject and questions and concerns were addressed prior to the signing of the consent form.  The subject verbalized understanding of the trail requirements.  The subject agreed to participate in the CADLAD trial and signed the informed consent.  The informed consent was obtained prior to performance of any protocol-specific procedures for the subject.  A copy of the signed informed consent was given to the subject and a copy was placed in the subject's medical record.  Hedrick,Amish Mintzer W 09/14/2015, 2010

## 2015-09-14 NOTE — Progress Notes (Signed)
09/14/2015 6:41 PM Discharge instructions discussed with patient.  Discharge papers given to pt and have been signed.  Waiting on pt's family member to arrive to take him home. Kathryne HitchAllen, Patryck Kilgore C

## 2015-09-15 ENCOUNTER — Encounter (HOSPITAL_COMMUNITY): Payer: Self-pay | Admitting: Cardiology

## 2015-09-17 ENCOUNTER — Other Ambulatory Visit: Payer: Self-pay | Admitting: Family Medicine

## 2015-09-23 ENCOUNTER — Ambulatory Visit (INDEPENDENT_AMBULATORY_CARE_PROVIDER_SITE_OTHER): Payer: Federal, State, Local not specified - PPO | Admitting: Family Medicine

## 2015-09-23 ENCOUNTER — Encounter: Payer: Self-pay | Admitting: Family Medicine

## 2015-09-23 VITALS — BP 122/76 | HR 78 | Ht 69.0 in | Wt 185.0 lb

## 2015-09-23 DIAGNOSIS — M9902 Segmental and somatic dysfunction of thoracic region: Secondary | ICD-10-CM

## 2015-09-23 DIAGNOSIS — M9901 Segmental and somatic dysfunction of cervical region: Secondary | ICD-10-CM | POA: Diagnosis not present

## 2015-09-23 DIAGNOSIS — M9908 Segmental and somatic dysfunction of rib cage: Secondary | ICD-10-CM | POA: Diagnosis not present

## 2015-09-23 DIAGNOSIS — F419 Anxiety disorder, unspecified: Secondary | ICD-10-CM

## 2015-09-23 DIAGNOSIS — M5090 Cervical disc disorder, unspecified, unspecified cervical region: Secondary | ICD-10-CM | POA: Diagnosis not present

## 2015-09-23 DIAGNOSIS — M999 Biomechanical lesion, unspecified: Secondary | ICD-10-CM

## 2015-09-23 MED ORDER — VITAMIN D (ERGOCALCIFEROL) 1.25 MG (50000 UNIT) PO CAPS: 50000.0000 [IU] | ORAL_CAPSULE | ORAL | Status: DC

## 2015-09-23 MED ORDER — HYDROXYZINE HCL 25 MG PO TABS: 25.0000 mg | ORAL_TABLET | Freq: Three times a day (TID) | ORAL | Status: DC | PRN

## 2015-09-23 NOTE — Assessment & Plan Note (Signed)
Additional longer having significant amount of radicular symptoms. I do think that patient is having some psychosomatic pain as well. We discussed icing regimen and home exercises. We discussed continuing to involving time for himself. I do think that anxiety is also contributing. We discussed icing. I would like to see patient though quicker in 2-3 weeks than usual.

## 2015-09-23 NOTE — Assessment & Plan Note (Signed)
Decision today to treat with OMT was based on Physical Exam  After verbal consent patient was treated with ME, FPR techniques in cervical, rib, thoracic areas  Patient tolerated the procedure well with improvement in symptoms  Patient given exercises, stretches and lifestyle modifications  See medications in patient instructions if given  Patient will follow up in 2-3 weeks  

## 2015-09-23 NOTE — Progress Notes (Signed)
Pre visit review using our clinic review tool, if applicable. No additional management support is needed unless otherwise documented below in the visit note. 

## 2015-09-23 NOTE — Assessment & Plan Note (Signed)
Could be contribute in to some of his muscle tightness. We discussed hydroxyzine. Given a prescription to try this token also help as more of a muscle relaxant. We discussed ergonomic changes. We discussed vitamin D deficiency could also contribute. Patient will start this. In addition patient will continue to see me for manipulation. See him again in 2-3 weeks for further evaluation and treatment.

## 2015-09-23 NOTE — Patient Instructions (Signed)
Good to see you Stay active and do find time for yourself.  Get a small christmas present for yourself.  I will take the heat from your wife ;) Hydroxyzine up to 3 times daily to try when feel a little overwhelmed New vitamin D [id number] IU weekly for next 12 weeks.  See me again in 2-3 weeks to make sure we are making progress Happy holidays!

## 2015-09-23 NOTE — Progress Notes (Signed)
  Tawana ScaleZach Smith D.O. [facility] [address] [place], [place] [address] Phone: [phone] Subjective:     CC: Right arm pain and neck pain follow up  MVH:[id number]:Subjective Darren DaftLarry T Salazar is a 44 y.o. male coming in for follow-up of right arm pain and neck pain. Patient says that overall he is feeling very good. Patient does have known cervical degenerative disc disease. Patient has noticed some significant increasing tightness. Patient has had some difficulty with doing the exercises regularly. Patient recently did have an hospital and initiation and needed a catheterization for possible chest pain. Marga HootsOakley everything was normal. Possible reflux disease. Patient continues to have some anxiety that could be secondary to this health or the other things that are happening in his life. A lot of insecurity with her job at the moment. Denies any depression or any suicidal or homicidal ideation. Patient states that overall he just feels like he does not have as much control and has difficulty focusing on himself.   Past medical history: Patient also has some cervical neck pain. Since 2013 patient is known that he does have cervical radiculopathy with impingement on the left side.     Past medical history, social, surgical and family history all reviewed in electronic medical record.   Review of Systems: No headache, visual changes, nausea, vomiting, diarrhea, constipation, dizziness, abdominal pain, skin rash, fevers, chills, night sweats, weight loss, swollen lymph nodes, body aches, joint swelling, muscle aches, chest pain, shortness of breath, mood changes.   Objective Blood pressure 122/76, pulse 78, height 5\' 9"  (1.753 m), weight 185 lb (83.915 kg), SpO2 98 %.  General: No apparent distress alert and oriented x3 mood and affect normal, dressed appropriately. More melancholy than baseline.  HEENT: Pupils equal, extraocular movements intact  Respiratory: Patient's speak in full sentences and  does not appear short of breath  Cardiovascular: No lower extremity edema, non tender, no erythema  Skin: Warm dry intact with no signs of infection or rash on extremities or on axial skeleton.  Abdomen: Soft minimally tender in the super pubic area. Neuro: Cranial nerves II through XII are intact, neurovascularly intact in all extremities with 2+ DTRs and 2+ pulses.  Lymph: No lymphadenopathy of posterior or anterior cervical chain or axillae bilaterally.  Gait normal with good balance and coordination.  MSK:  Non tender with full range of motion and good stability and symmetric strength and tone of shoulders,  wrist, hip, knee and ankles bilaterally.    Neck: Inspection unremarkable. No palpable stepoffs. Negative Spurling's maneuver today.. Increasing stiffness of the musculature of the neck. Grip strength and sensation normal in bilateral hands Strength good C4 to T1 distribution No sensory change to C4 to T1 Negative Hoffman sign bilaterally Reflexes normal  OMT Physical Exam  Cervical  C2 F RS right C4 flexed rotated and side bent left C7 F RS right  Thoracic T1 E RS left with elevated first rib T5 extended rotated and side bent right   Lumbar L2 flexed rotated inside that right  Sacrum left on left   Impression and Recommendations:     This case required medical decision making of moderate complexity.

## 2015-09-28 ENCOUNTER — Ambulatory Visit (INDEPENDENT_AMBULATORY_CARE_PROVIDER_SITE_OTHER): Payer: Federal, State, Local not specified - PPO | Admitting: Nurse Practitioner

## 2015-09-28 ENCOUNTER — Encounter: Payer: Self-pay | Admitting: Nurse Practitioner

## 2015-09-28 VITALS — BP 138/86 | HR 78 | Ht 69.0 in | Wt 188.6 lb

## 2015-09-28 DIAGNOSIS — I200 Unstable angina: Secondary | ICD-10-CM | POA: Diagnosis not present

## 2015-09-28 DIAGNOSIS — Z9889 Other specified postprocedural states: Secondary | ICD-10-CM | POA: Diagnosis not present

## 2015-09-28 NOTE — Patient Instructions (Addendum)
We will be checking the following labs today - NONE   Medication Instructions:    Continue with your current medicines.     Testing/Procedures To Be Arranged:  N/A  Follow-Up:   See back as needed.     Other Special Instructions:   Keep working on cardiac risk factors    If you need a refill on your cardiac medications before your next appointment, please call your pharmacy.   Call the [facility] office at [phone] if you have any questions, problems or concerns.
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
## 2015-10-14 ENCOUNTER — Ambulatory Visit (INDEPENDENT_AMBULATORY_CARE_PROVIDER_SITE_OTHER): Payer: Federal, State, Local not specified - PPO | Admitting: Family Medicine

## 2015-10-14 ENCOUNTER — Other Ambulatory Visit (INDEPENDENT_AMBULATORY_CARE_PROVIDER_SITE_OTHER): Payer: Federal, State, Local not specified - PPO

## 2015-10-14 ENCOUNTER — Encounter: Payer: Self-pay | Admitting: Family Medicine

## 2015-10-14 VITALS — BP 122/80 | HR 118 | Wt 179.0 lb

## 2015-10-14 DIAGNOSIS — M5010 Cervical disc disorder with radiculopathy, unspecified cervical region: Secondary | ICD-10-CM

## 2015-10-14 DIAGNOSIS — M353 Polymyalgia rheumatica: Secondary | ICD-10-CM

## 2015-10-14 LAB — CBC WITH DIFFERENTIAL/PLATELET
BASOS PCT: 0.6 % (ref 0.0–3.0)
Basophils Absolute: 0 10*3/uL (ref 0.0–0.1)
EOS ABS: 0.1 10*3/uL (ref 0.0–0.7)
Eosinophils Relative: 2.4 % (ref 0.0–5.0)
HEMATOCRIT: 49.1 % (ref 39.0–52.0)
Hemoglobin: 16.5 g/dL (ref 13.0–17.0)
LYMPHS PCT: 30.6 % (ref 12.0–46.0)
Lymphs Abs: 1.6 10*3/uL (ref 0.7–4.0)
MCHC: 33.7 g/dL (ref 30.0–36.0)
MCV: 90.1 fl (ref 78.0–100.0)
MONOS PCT: 10.5 % (ref 3.0–12.0)
Monocytes Absolute: 0.5 10*3/uL (ref 0.1–1.0)
NEUTROS ABS: 2.8 10*3/uL (ref 1.4–7.7)
Neutrophils Relative %: 55.9 % (ref 43.0–77.0)
PLATELETS: 207 10*3/uL (ref 150.0–400.0)
RBC: 5.45 Mil/uL (ref 4.22–5.81)
RDW: 12.7 % (ref 11.5–15.5)
WBC: 5.1 10*3/uL (ref 4.0–10.5)

## 2015-10-14 LAB — SEDIMENTATION RATE: Sed Rate: 7 mm/hr (ref 0–22)

## 2015-10-14 LAB — C-REACTIVE PROTEIN: CRP: 0.1 mg/dL — ABNORMAL LOW (ref 0.5–20.0)

## 2015-10-14 MED ORDER — METHYLPREDNISOLONE ACETATE 80 MG/ML IJ SUSP
80.0000 mg | Freq: Once | INTRAMUSCULAR | Status: AC
  Administered 2015-10-14: 80 mg via INTRAMUSCULAR

## 2015-10-14 MED ORDER — TIZANIDINE HCL 4 MG PO TABS: 4.0000 mg | ORAL_TABLET | Freq: Three times a day (TID) | ORAL | Status: DC | PRN

## 2015-10-14 MED ORDER — KETOROLAC TROMETHAMINE 60 MG/2ML IM SOLN
60.0000 mg | Freq: Once | INTRAMUSCULAR | Status: AC
  Administered 2015-10-14: 60 mg via INTRAMUSCULAR

## 2015-10-14 MED ORDER — PREDNISONE 50 MG PO TABS: 50.0000 mg | ORAL_TABLET | Freq: Every day | ORAL | Status: DC

## 2015-10-14 NOTE — Assessment & Plan Note (Signed)
Significant worsening at this time. I do believe the patient is having more cervical radiculopathy. I do think the patient is having possibly even some small herniated disc. Patient does have some underlying anxiousness that also is contribute in. Patient was given 2 injections today to decrease inflammation and we'll do prednisone for 5 days. We discussed muscle relaxer and given a prescription as well. We discussed different labs to rule out any type of infectious etiology with patient recently having a hard procedure was unremarkable. Patient is trying to control his blood pressure as well as blood sugar. Sometimes feel that this is very difficult. Patient and will come back and see me again in 7-10 days to make sure she he is responding well. If any worsening symptoms of many consider further imaging.

## 2015-10-14 NOTE — Progress Notes (Signed)
  Tawana ScaleZach Salazar D.O. [facility] [address] [place], [place] [address] Phone: [phone]) [phone] Subjective:     CC: Right arm pain and neck pain follow up  JJO:[id number]:Subjective Darren DaftLarry T Salazar is a 45 y.o. male coming in for follow-up of right arm pain and neck pain. Patient says that overall he is feeling very good. Patient does have known cervical degenerative disc disease. Patient is unfortunate having worsening symptoms over the course last 10 days. Having radicular symptoms going down the arm. Seems to be worse than when he had his epidural. Patient states that sometimes feels that his left arm is heavy overall. Would not consider a weakness at this point. Patient states that this Diffley related to stress but unfortunately thinks that this is also a muscular issue.  Patient was seen previously and was diagnosed with some mild anxiousness. Was given pain medication which patient twisted use for muscle relaxer. Try that only one time. Does not know if it made a benefit are not.   Past medical history: Patient also has some cervical neck pain. Since 2013 patient is known that he does have cervical radiculopathy with impingement on the left side.     Past medical history, social, surgical and family history all reviewed in electronic medical record.   Review of Systems: No headache, visual changes, nausea, vomiting, diarrhea, constipation, dizziness, abdominal pain, skin rash, fevers, chills, night sweats, weight loss, swollen lymph nodes, body aches, joint swelling, muscle aches, chest pain, shortness of breath, mood changes.   Objective There were no vitals taken for this visit.  General: No apparent distress alert and oriented x3 mood and affect normal, dressed appropriately. More melancholy than baseline.  HEENT: Pupils equal, extraocular movements intact  Respiratory: Patient's speak in full sentences and does not appear short of breath  Cardiovascular: No lower extremity edema, non  tender, no erythema  Skin: Warm dry intact with no signs of infection or rash on extremities or on axial skeleton.  Abdomen: Soft minimally tender in the super pubic area. Neuro: Cranial nerves II through XII are intact, neurovascularly intact in all extremities with 2+ DTRs and 2+ pulses.  Lymph: No lymphadenopathy of posterior or anterior cervical chain or axillae bilaterally.  Gait normal with good balance and coordination.  MSK:  Non tender with full range of motion and good stability and symmetric strength and tone of shoulders,  wrist, hip, knee and ankles bilaterally.    Neck: Inspection unremarkable. No palpable stepoffs. Positive Spurling's maneuver today.. Severe stiffness of the neck with mild decreased range of motion with left-sided rotation and side bending. Grip strength and sensation normal in bilateral hands Strength good C4 to T1 distribution No sensory change to C4 to T1 Negative Hoffman sign bilaterally Reflexes normal   Impression and Recommendations:     This case required medical decision making of moderate complexity.

## 2015-10-14 NOTE — Patient Instructions (Addendum)
Sorry this year started off on the wrong foot.  Ice is your friend 2 injections today Prednisone starting tomorrow and for 5 days Muscle relaxer up to 3 times a day as needed Try to keep moving Get labs today See me again in 7-10 days.

## 2015-10-21 ENCOUNTER — Encounter: Payer: Self-pay | Admitting: Family Medicine

## 2015-10-21 DIAGNOSIS — F411 Generalized anxiety disorder: Secondary | ICD-10-CM

## 2015-10-23 ENCOUNTER — Encounter: Payer: Self-pay | Admitting: Family Medicine

## 2015-10-26 ENCOUNTER — Encounter: Payer: Self-pay | Admitting: Family Medicine

## 2015-10-26 ENCOUNTER — Other Ambulatory Visit: Payer: Federal, State, Local not specified - PPO

## 2015-10-26 ENCOUNTER — Ambulatory Visit (INDEPENDENT_AMBULATORY_CARE_PROVIDER_SITE_OTHER): Payer: Federal, State, Local not specified - PPO | Admitting: Family Medicine

## 2015-10-26 VITALS — BP 118/80 | HR 116

## 2015-10-26 DIAGNOSIS — I10 Essential (primary) hypertension: Secondary | ICD-10-CM

## 2015-10-26 DIAGNOSIS — M5010 Cervical disc disorder with radiculopathy, unspecified cervical region: Secondary | ICD-10-CM | POA: Diagnosis not present

## 2015-10-26 DIAGNOSIS — F419 Anxiety disorder, unspecified: Secondary | ICD-10-CM | POA: Diagnosis not present

## 2015-10-26 DIAGNOSIS — R0789 Other chest pain: Secondary | ICD-10-CM

## 2015-10-26 NOTE — Patient Instructions (Signed)
Good to see you Ice is your friend for pain Consider melatonin 3-5mg  at night to help with sleep Hydroxyzine you should try and if not good then we can consider something stronger With the symptoms you are telling me a follow up with card is not a bad idea.  Dr. Dellia [place] office should be getting to you soon can call then at [phone] Send me a message in 1-2 weeks to tell me how you are doing.

## 2015-10-26 NOTE — Assessment & Plan Note (Signed)
Patient does have known arthritic changes of the neck. There can be giving him some of the difficulty but not giving M.D. anxiety, nervousness, sweating or nausea that he feels from time to time. I believe that his pain is fairly well controlled actually. We discussed different treatment options were discussed with kidney the epidurals but once again this does not seem to be the primary problem at this time.

## 2015-10-26 NOTE — Assessment & Plan Note (Signed)
Encourage patient to follow-up with cardiology for possible Holter monitor tests.

## 2015-10-26 NOTE — Progress Notes (Signed)
Pre visit review using our clinic review tool, if applicable. No additional management support is needed unless otherwise documented below in the visit note. 

## 2015-10-26 NOTE — Assessment & Plan Note (Signed)
Patient is having some increasing anxiousness. I do believe that patient has had some recent diagnosis's of the diabetes as well as his recent cardiac workup that has caused some increase in anxiety. Patient has been referred to behavioral health for further evaluation. Hopefully that this will happen at some time in the near future. Discussed though with patient also having the sweating episodes, hypertension, and hyperglycemia I would like to make sure that patient's adrenal glands are functioning appropriately. Labs ordered today. We discussed icing regimen if necessary for anything. We discussed possibly keeping internal for food to see if anything else could be contribute. Patient will try the hydroxyzine for breakthrough pain. We discussed the possibility of getting a low dose of Valium to try which patient declined at this time. Patient will send a message in the next 2 weeks for further evaluation otherwise follow-up with me in 3 weeks. Patient is also attempting to follow-up with primary care physician in the near future.  Spent  25 minutes with patient face-to-face and had greater than 50% of counseling including as described above in assessment and plan.
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
## 2015-10-27 ENCOUNTER — Other Ambulatory Visit: Payer: Self-pay | Admitting: Family Medicine

## 2015-10-27 ENCOUNTER — Telehealth: Payer: Self-pay | Admitting: Family Medicine

## 2015-10-27 ENCOUNTER — Ambulatory Visit (HOSPITAL_BASED_OUTPATIENT_CLINIC_OR_DEPARTMENT_OTHER)
Admission: RE | Admit: 2015-10-27 | Discharge: 2015-10-27 | Disposition: A | Discharge status: Home / Self Care | Payer: Federal, State, Local not specified - PPO | Source: Ambulatory Visit | Attending: Family Medicine | Admitting: Family Medicine

## 2015-10-27 ENCOUNTER — Ambulatory Visit: Payer: Federal, State, Local not specified - PPO | Admitting: Physician Assistant

## 2015-10-27 ENCOUNTER — Encounter: Payer: Self-pay | Admitting: Family Medicine

## 2015-10-27 ENCOUNTER — Ambulatory Visit (INDEPENDENT_AMBULATORY_CARE_PROVIDER_SITE_OTHER): Payer: Federal, State, Local not specified - PPO | Admitting: Family Medicine

## 2015-10-27 VITALS — BP 122/82 | HR 96 | Temp 98.4°F | Ht 69.0 in | Wt 176.5 lb

## 2015-10-27 DIAGNOSIS — M4802 Spinal stenosis, cervical region: Secondary | ICD-10-CM | POA: Diagnosis not present

## 2015-10-27 DIAGNOSIS — M549 Dorsalgia, unspecified: Secondary | ICD-10-CM

## 2015-10-27 DIAGNOSIS — M50222 Other cervical disc displacement at C5-C6 level: Secondary | ICD-10-CM | POA: Diagnosis not present

## 2015-10-27 DIAGNOSIS — I10 Essential (primary) hypertension: Secondary | ICD-10-CM

## 2015-10-27 DIAGNOSIS — R079 Chest pain, unspecified: Secondary | ICD-10-CM | POA: Diagnosis not present

## 2015-10-27 DIAGNOSIS — M5010 Cervical disc disorder with radiculopathy, unspecified cervical region: Secondary | ICD-10-CM

## 2015-10-27 DIAGNOSIS — R000 Tachycardia, unspecified: Secondary | ICD-10-CM | POA: Diagnosis not present

## 2015-10-27 DIAGNOSIS — M542 Cervicalgia: Secondary | ICD-10-CM

## 2015-10-27 DIAGNOSIS — R002 Palpitations: Secondary | ICD-10-CM

## 2015-10-27 DIAGNOSIS — R1013 Epigastric pain: Secondary | ICD-10-CM

## 2015-10-27 DIAGNOSIS — E782 Mixed hyperlipidemia: Secondary | ICD-10-CM

## 2015-10-27 DIAGNOSIS — G8929 Other chronic pain: Secondary | ICD-10-CM | POA: Insufficient documentation

## 2015-10-27 DIAGNOSIS — R634 Abnormal weight loss: Secondary | ICD-10-CM | POA: Diagnosis not present

## 2015-10-27 DIAGNOSIS — M50322 Other cervical disc degeneration at C5-C6 level: Secondary | ICD-10-CM | POA: Insufficient documentation

## 2015-10-27 DIAGNOSIS — E119 Type 2 diabetes mellitus without complications: Secondary | ICD-10-CM

## 2015-10-27 LAB — H. PYLORI ANTIBODY, IGG: H Pylori IgG: NEGATIVE

## 2015-10-27 LAB — TSH: TSH: 0.55 u[IU]/mL (ref 0.35–4.50)

## 2015-10-27 MED ORDER — METOPROLOL TARTRATE 25 MG PO TABS: 12.5000 mg | ORAL_TABLET | Freq: Two times a day (BID) | ORAL | Status: DC | PRN

## 2015-10-27 NOTE — Patient Instructions (Addendum)
Salonpas with lidocaine. Normal range for systolic blood pressure is 100-135  Diastolic is 60-90 If top half of range then add Metoprolol to Lisinopril If lower 1/2 of range then hold lisinopril when taking metoprolol.  Nonspecific Tachycardia  Tachycardia is a faster than normal heartbeat (more than 100 beats per minute). In adults, the heart normally beats between 60 and 100 times a minute. A fast heartbeat may be a normal response to exercise or stress. It does not necessarily mean that something is wrong. However, sometimes when your heart beats too fast it may not be able to pump enough blood to the rest of your body. This can result in chest pain, shortness of breath, dizziness, and even fainting. Nonspecific tachycardia means that the specific cause or pattern of your tachycardia is unknown. CAUSES  Tachycardia may be harmless or it may be due to a more serious underlying cause. Possible causes of tachycardia include:  Exercise or exertion.  Fever.  Pain or injury.  Infection.  Loss of body fluids (dehydration).  Overactive thyroid.  Lack of red blood cells (anemia).  Anxiety and stress.  Alcohol.  Caffeine.  Tobacco products.  Diet pills.  Illegal drugs.  Heart disease. SYMPTOMS  Rapid or irregular heartbeat (palpitations).  Suddenly feeling your heart beating (cardiac awareness).  Dizziness.  Tiredness (fatigue).  Shortness of breath.  Chest pain.  Nausea.  Fainting. DIAGNOSIS  Your caregiver will perform a physical exam and take your medical history. In some cases, a heart specialist (cardiologist) may be consulted. Your caregiver may also order:  Blood tests.  Electrocardiography. This test records the electrical activity of your heart.  A heart monitoring test. TREATMENT  Treatment will depend on the likely cause of your tachycardia. The goal is to treat the underlying cause of your tachycardia. Treatment methods may include:  Replacement  of fluids or blood through an intravenous (IV) tube for moderate to severe dehydration or anemia.  New medicines or changes in your current medicines.  Diet and lifestyle changes.  Treatment for certain infections.  Stress relief or relaxation methods. HOME CARE INSTRUCTIONS   Rest.  Drink enough fluids to keep your urine clear or pale yellow.  Do not smoke.  Avoid:  Caffeine.  Tobacco.  Alcohol.  Chocolate.  Stimulants such as over-the-counter diet pills or pills that help you stay awake.  Situations that cause anxiety or stress.  Illegal drugs such as marijuana, phencyclidine (PCP), and cocaine.  Only take medicine as directed by your caregiver.  Keep all follow-up appointments as directed by your caregiver. SEEK IMMEDIATE MEDICAL CARE IF:   You have pain in your chest, upper arms, jaw, or neck.  You become weak, dizzy, or feel faint.  You have palpitations that will not go away.  You vomit, have diarrhea, or pass blood in your stool.  Your skin is cool, pale, and wet.  You have a fever that will not go away with rest, fluids, and medicine. MAKE SURE YOU:   Understand these instructions.  Will watch your condition.  Will get help right away if you are not doing well or get worse.   This information is not intended to replace advice given to you by your health care provider. Make sure you discuss any questions you have with your health care provider.   Document Released: 11/03/2004 Document Revised: 12/19/2011 Document Reviewed: 04/10/2015 Elsevier Interactive Patient Education [employer]

## 2015-10-27 NOTE — Progress Notes (Signed)
Pre visit review using our clinic review tool, if applicable. No additional management support is needed unless otherwise documented below in the visit note. 

## 2015-10-27 NOTE — Telephone Encounter (Signed)
Pt saw Dr. Abner Greenspan today (10/27/15) at 1pm.
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
## 2015-10-27 NOTE — Telephone Encounter (Signed)
Patient Name: Darren Salazar  DOB: Nov 10, 1970    Initial Comment caller states heart rate elevated   Nurse Assessment  Nurse: Vickey Sages, RN, Jacquilin Date/Time (Eastern Time): 10/27/2015 11:44:02 AM  Confirm and document reason for call. If symptomatic, describe symptoms. You must click the next button to save text entered. ---caller states heart rate elevated- HR 118-120  Has the patient traveled out of the country within the last 30 days? ---No  Does the patient have any new or worsening symptoms? ---Yes  Will a triage be completed? ---Yes  Related visit to physician within the last 2 weeks? ---No  Does the PT have any chronic conditions? (i.e. diabetes, asthma, etc.) ---Yes  List chronic conditions. ---HTN, chronic neck pain, diabetic  Is this a behavioral health or substance abuse call? ---No     Guidelines    Guideline Title Affirmed Question Affirmed Notes  Heart Rate and Heartbeat Questions Problems with anxiety or stress    Final Disposition User   See PCP within 2 Dion Body, RN, Jacquilin    Referrals  REFERRED TO PCP OFFICE   Disagree/Comply: Comply

## 2015-10-28 ENCOUNTER — Ambulatory Visit (INDEPENDENT_AMBULATORY_CARE_PROVIDER_SITE_OTHER): Payer: Federal, State, Local not specified - PPO

## 2015-10-28 DIAGNOSIS — R002 Palpitations: Secondary | ICD-10-CM | POA: Diagnosis not present

## 2015-10-28 DIAGNOSIS — R079 Chest pain, unspecified: Secondary | ICD-10-CM | POA: Diagnosis not present

## 2015-10-28 LAB — RENIN

## 2015-10-28 LAB — ACTH: C206 ACTH: 24 pg/mL (ref 6–50)

## 2015-10-31 ENCOUNTER — Ambulatory Visit (HOSPITAL_BASED_OUTPATIENT_CLINIC_OR_DEPARTMENT_OTHER)
Admission: RE | Admit: 2015-10-31 | Discharge: 2015-10-31 | Disposition: A | Discharge status: Home / Self Care | Payer: Federal, State, Local not specified - PPO | Source: Ambulatory Visit | Attending: Family Medicine | Admitting: Family Medicine

## 2015-10-31 DIAGNOSIS — R079 Chest pain, unspecified: Secondary | ICD-10-CM | POA: Diagnosis not present

## 2015-10-31 DIAGNOSIS — M79602 Pain in left arm: Secondary | ICD-10-CM | POA: Insufficient documentation

## 2015-10-31 DIAGNOSIS — M50221 Other cervical disc displacement at C4-C5 level: Secondary | ICD-10-CM | POA: Diagnosis not present

## 2015-10-31 DIAGNOSIS — M79601 Pain in right arm: Secondary | ICD-10-CM | POA: Diagnosis not present

## 2015-10-31 DIAGNOSIS — M4802 Spinal stenosis, cervical region: Secondary | ICD-10-CM | POA: Diagnosis not present

## 2015-10-31 DIAGNOSIS — R531 Weakness: Secondary | ICD-10-CM | POA: Diagnosis not present

## 2015-10-31 DIAGNOSIS — M2578 Osteophyte, vertebrae: Secondary | ICD-10-CM | POA: Insufficient documentation

## 2015-10-31 DIAGNOSIS — M542 Cervicalgia: Secondary | ICD-10-CM | POA: Diagnosis not present

## 2015-11-02 ENCOUNTER — Other Ambulatory Visit: Payer: Self-pay | Admitting: Family Medicine

## 2015-11-02 DIAGNOSIS — M542 Cervicalgia: Secondary | ICD-10-CM

## 2015-11-04 ENCOUNTER — Ambulatory Visit (INDEPENDENT_AMBULATORY_CARE_PROVIDER_SITE_OTHER): Payer: Federal, State, Local not specified - PPO | Admitting: Nurse Practitioner

## 2015-11-04 ENCOUNTER — Telehealth: Payer: Self-pay | Admitting: Family Medicine

## 2015-11-04 ENCOUNTER — Encounter: Payer: Self-pay | Admitting: Nurse Practitioner

## 2015-11-04 VITALS — BP 130/84 | HR 100 | Ht 69.0 in | Wt 176.4 lb

## 2015-11-04 DIAGNOSIS — R000 Tachycardia, unspecified: Secondary | ICD-10-CM | POA: Diagnosis not present

## 2015-11-04 DIAGNOSIS — I10 Essential (primary) hypertension: Secondary | ICD-10-CM | POA: Diagnosis not present

## 2015-11-04 DIAGNOSIS — E785 Hyperlipidemia, unspecified: Secondary | ICD-10-CM

## 2015-11-04 MED ORDER — METOPROLOL TARTRATE 25 MG PO TABS: 12.5000 mg | ORAL_TABLET | Freq: Two times a day (BID) | ORAL | Status: DC
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
## 2015-11-04 NOTE — Telephone Encounter (Signed)
Relation to pt: self  Call back number:[phone]   Reason for call:  Patient returning your call

## 2015-11-04 NOTE — Telephone Encounter (Signed)
Patient informed of results.  

## 2015-11-04 NOTE — Patient Instructions (Addendum)
We will be checking the following labs today - NONE   Medication Instructions:    Continue with your current medicines. BUT  I want you to take the Lopressor 25 mg two times a day - every day - morning and night - this has been sent to your drug store.     Testing/Procedures To Be Arranged:  Echocardiogram  Follow-Up:   See Dr. Delton See in 1 month for follow up    Other Special Instructions:   N/A    If you need a refill on your cardiac medications before your next appointment, please call your pharmacy.   Call the [facility] office at [phone] if you have any questions, problems or concerns.

## 2015-11-05 ENCOUNTER — Other Ambulatory Visit: Payer: Self-pay | Admitting: Family Medicine

## 2015-11-05 DIAGNOSIS — G479 Sleep disorder, unspecified: Secondary | ICD-10-CM

## 2015-11-05 DIAGNOSIS — R0683 Snoring: Secondary | ICD-10-CM

## 2015-11-05 DIAGNOSIS — I499 Cardiac arrhythmia, unspecified: Secondary | ICD-10-CM

## 2015-11-07 LAB — ALDOSTERONE + RENIN ACTIVITY W/ RATIO
ALDO / PRA Ratio: 0.1 Ratio — ABNORMAL LOW (ref 0.9–28.9)
Aldosterone: 3 ng/dL
PRA LC/MS/MS: 35.63 ng/mL/h — AB (ref 0.25–5.82)

## 2015-11-08 ENCOUNTER — Encounter: Payer: Self-pay | Admitting: Family Medicine

## 2015-11-08 DIAGNOSIS — R634 Abnormal weight loss: Secondary | ICD-10-CM | POA: Insufficient documentation

## 2015-11-08 DIAGNOSIS — R000 Tachycardia, unspecified: Secondary | ICD-10-CM | POA: Insufficient documentation

## 2015-11-08 DIAGNOSIS — R1013 Epigastric pain: Secondary | ICD-10-CM | POA: Insufficient documentation
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
## 2015-11-08 NOTE — Assessment & Plan Note (Signed)
Tolerating statin, encouraged heart healthy diet, avoid trans fats, minimize simple carbs and saturated fats. Increase exercise as tolerated 

## 2015-11-08 NOTE — Assessment & Plan Note (Signed)
Started on Metoprolol, avoid caffeine and Holter Monitor placed. Seek care as needed

## 2015-11-08 NOTE — Assessment & Plan Note (Signed)
hgba1c acceptable, minimize simple carbs. Increase exercise as tolerated. Continue current meds 

## 2015-11-08 NOTE — Assessment & Plan Note (Addendum)
Avoid offending foods, start probiotics. Do not eat large meals in late evening and consider raising head of bed. Likely contributing to chest pain. H Pylori negative. Consider Ranitidine prn. May need referral to Gastroenterology if symptoms continue

## 2015-11-08 NOTE — Assessment & Plan Note (Signed)
Well controlled, no changes to meds. Encouraged heart healthy diet such as the DASH diet and exercise as tolerated.  °

## 2015-11-09 ENCOUNTER — Emergency Department (HOSPITAL_COMMUNITY): Payer: Federal, State, Local not specified - PPO

## 2015-11-09 ENCOUNTER — Emergency Department (HOSPITAL_COMMUNITY)
Admission: EM | Admit: 2015-11-09 | Discharge: 2015-11-09 | Disposition: A | Discharge status: Home / Self Care | Payer: Federal, State, Local not specified - PPO | Attending: Emergency Medicine | Admitting: Emergency Medicine

## 2015-11-09 ENCOUNTER — Other Ambulatory Visit: Payer: Self-pay

## 2015-11-09 ENCOUNTER — Encounter (HOSPITAL_COMMUNITY): Payer: Self-pay | Admitting: *Deleted

## 2015-11-09 DIAGNOSIS — I10 Essential (primary) hypertension: Secondary | ICD-10-CM | POA: Insufficient documentation

## 2015-11-09 DIAGNOSIS — R002 Palpitations: Secondary | ICD-10-CM | POA: Diagnosis not present

## 2015-11-09 DIAGNOSIS — Z79899 Other long term (current) drug therapy: Secondary | ICD-10-CM | POA: Insufficient documentation

## 2015-11-09 DIAGNOSIS — Z7984 Long term (current) use of oral hypoglycemic drugs: Secondary | ICD-10-CM | POA: Diagnosis not present

## 2015-11-09 DIAGNOSIS — E119 Type 2 diabetes mellitus without complications: Secondary | ICD-10-CM | POA: Diagnosis not present

## 2015-11-09 DIAGNOSIS — R000 Tachycardia, unspecified: Secondary | ICD-10-CM | POA: Insufficient documentation

## 2015-11-09 LAB — CBC
HEMATOCRIT: 44.4 % (ref 39.0–52.0)
Hemoglobin: 15.6 g/dL (ref 13.0–17.0)
MCH: 30.8 pg (ref 26.0–34.0)
MCHC: 35.1 g/dL (ref 30.0–36.0)
MCV: 87.7 fL (ref 78.0–100.0)
PLATELETS: 257 10*3/uL (ref 150–400)
RBC: 5.06 MIL/uL (ref 4.22–5.81)
RDW: 12.4 % (ref 11.5–15.5)
WBC: 6.4 10*3/uL (ref 4.0–10.5)

## 2015-11-09 LAB — BASIC METABOLIC PANEL
ANION GAP: 20 — AB (ref 5–15)
BUN: 14 mg/dL (ref 6–20)
CHLORIDE: 95 mmol/L — AB (ref 101–111)
CO2: 21 mmol/L — ABNORMAL LOW (ref 22–32)
CREATININE: 1.16 mg/dL (ref 0.61–1.24)
Calcium: 10.1 mg/dL (ref 8.9–10.3)
GFR calc non Af Amer: >60 mL/min (ref >60)
Glucose, Bld: 184 mg/dL — ABNORMAL HIGH (ref 65–99)
POTASSIUM: 3.9 mmol/L (ref 3.5–5.1)
SODIUM: 136 mmol/L (ref 135–145)

## 2015-11-09 LAB — I-STAT TROPONIN, ED: Troponin i, poc: 0 ng/mL (ref 0.00–0.08)

## 2015-11-09 MED ORDER — METOPROLOL TARTRATE 25 MG PO TABS
25.0000 mg | ORAL_TABLET | Freq: Once | ORAL | Status: AC
  Administered 2015-11-09: 25 mg via ORAL
  Filled 2015-11-09: qty 1

## 2015-11-09 MED ORDER — METOPROLOL TARTRATE 50 MG PO TABS: 50.0000 mg | ORAL_TABLET | Freq: Two times a day (BID) | ORAL | Status: DC

## 2015-11-09 NOTE — Discharge Instructions (Signed)
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
## 2015-11-10 ENCOUNTER — Encounter: Payer: Self-pay | Admitting: Cardiology

## 2015-11-10 ENCOUNTER — Ambulatory Visit (INDEPENDENT_AMBULATORY_CARE_PROVIDER_SITE_OTHER): Payer: Federal, State, Local not specified - PPO | Admitting: Psychology

## 2015-11-10 DIAGNOSIS — F411 Generalized anxiety disorder: Secondary | ICD-10-CM | POA: Diagnosis not present

## 2015-11-16 ENCOUNTER — Telehealth: Payer: Self-pay | Admitting: *Deleted

## 2015-11-16 ENCOUNTER — Ambulatory Visit (HOSPITAL_COMMUNITY): Payer: Federal, State, Local not specified - PPO | Attending: Cardiovascular Disease

## 2015-11-16 ENCOUNTER — Other Ambulatory Visit: Payer: Self-pay

## 2015-11-16 DIAGNOSIS — E785 Hyperlipidemia, unspecified: Secondary | ICD-10-CM | POA: Insufficient documentation

## 2015-11-16 DIAGNOSIS — R000 Tachycardia, unspecified: Secondary | ICD-10-CM | POA: Diagnosis not present

## 2015-11-16 DIAGNOSIS — I10 Essential (primary) hypertension: Secondary | ICD-10-CM

## 2015-11-16 DIAGNOSIS — Z8249 Family history of ischemic heart disease and other diseases of the circulatory system: Secondary | ICD-10-CM | POA: Diagnosis not present

## 2015-11-16 DIAGNOSIS — E119 Type 2 diabetes mellitus without complications: Secondary | ICD-10-CM | POA: Diagnosis not present

## 2015-11-16 NOTE — Telephone Encounter (Signed)
I called pt and advised pt of Dawayne Patricia. NP recommendation to refer pt to EP due to Tachycardia. I advised pt that Glynda Jaeger EP scheduler will call him with an appt. Pt agreeable to plan of care.

## 2015-11-16 NOTE — Telephone Encounter (Signed)
Pt has been notified of echo results, findings and recmmendations about metoprolol per Dawayne Patricia. NP Pt advised that since he last saw Dawayne Patricia. NP he was seen in 11/09/15 ED and was told to increase Lopressor to 50 mg BID. I advised pt I will let Dawayne Patricia. NP and if she has any questions or wants to make any further changes we will call back. Pt agreeable to plan of care.

## 2015-11-17 ENCOUNTER — Encounter: Payer: Self-pay | Admitting: Nurse Practitioner

## 2015-11-18 ENCOUNTER — Ambulatory Visit: Payer: Federal, State, Local not specified - PPO | Attending: Neurosurgery | Admitting: Physical Therapy

## 2015-11-18 DIAGNOSIS — M542 Cervicalgia: Secondary | ICD-10-CM | POA: Diagnosis not present

## 2015-11-18 DIAGNOSIS — M6281 Muscle weakness (generalized): Secondary | ICD-10-CM | POA: Diagnosis present

## 2015-11-18 DIAGNOSIS — M5412 Radiculopathy, cervical region: Secondary | ICD-10-CM
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
## 2015-11-23 ENCOUNTER — Ambulatory Visit: Payer: Federal, State, Local not specified - PPO | Admitting: Internal Medicine

## 2015-11-24 ENCOUNTER — Ambulatory Visit (INDEPENDENT_AMBULATORY_CARE_PROVIDER_SITE_OTHER): Payer: Federal, State, Local not specified - PPO | Admitting: Family Medicine

## 2015-11-24 ENCOUNTER — Ambulatory Visit: Payer: Federal, State, Local not specified - PPO | Admitting: Physical Therapy

## 2015-11-24 ENCOUNTER — Encounter: Payer: Self-pay | Admitting: Family Medicine

## 2015-11-24 VITALS — BP 120/78 | HR 84 | Temp 98.3°F | Ht 69.0 in | Wt 175.4 lb

## 2015-11-24 DIAGNOSIS — R739 Hyperglycemia, unspecified: Secondary | ICD-10-CM

## 2015-11-24 DIAGNOSIS — M542 Cervicalgia: Secondary | ICD-10-CM

## 2015-11-24 DIAGNOSIS — M5412 Radiculopathy, cervical region: Secondary | ICD-10-CM

## 2015-11-24 DIAGNOSIS — E782 Mixed hyperlipidemia: Secondary | ICD-10-CM | POA: Diagnosis not present

## 2015-11-24 DIAGNOSIS — F419 Anxiety disorder, unspecified: Secondary | ICD-10-CM

## 2015-11-24 DIAGNOSIS — R072 Precordial pain: Secondary | ICD-10-CM | POA: Diagnosis not present

## 2015-11-24 DIAGNOSIS — E119 Type 2 diabetes mellitus without complications: Secondary | ICD-10-CM

## 2015-11-24 DIAGNOSIS — M6281 Muscle weakness (generalized): Secondary | ICD-10-CM

## 2015-11-24 DIAGNOSIS — R000 Tachycardia, unspecified: Secondary | ICD-10-CM

## 2015-11-24 LAB — HEMOGLOBIN A1C: Hgb A1c MFr Bld: 6.5 % (ref 4.6–6.5)

## 2015-11-24 MED ORDER — ALPRAZOLAM 0.25 MG PO TABS: 0.2500 mg | ORAL_TABLET | Freq: Two times a day (BID) | ORAL | Status: DC | PRN
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
## 2015-11-24 NOTE — Patient Instructions (Addendum)
[employer]  Food Choices for Gastroesophageal Reflux Disease, Adult When you have gastroesophageal reflux disease (GERD), the foods you eat and your eating habits are very important. Choosing the right foods can help ease your discomfort.  WHAT GUIDELINES DO I NEED TO FOLLOW?   Choose fruits, vegetables, whole grains, and low-fat dairy products.   Choose low-fat meat, fish, and poultry.  Limit fats such as oils, salad dressings, butter, nuts, and avocado.   Keep a food diary. This helps you identify foods that cause symptoms.   Avoid foods that cause symptoms. These may be different for everyone.   Eat small meals often instead of 3 large meals a day.   Eat your meals slowly, in a place where you are relaxed.   Limit fried foods.   Cook foods using methods other than frying.   Avoid drinking alcohol.   Avoid drinking large amounts of liquids with your meals.   Avoid bending over or lying down until 2-3 hours after eating.  WHAT FOODS ARE NOT RECOMMENDED?  These are some foods and drinks that may make your symptoms worse: Vegetables Tomatoes. Tomato juice. Tomato and spaghetti sauce. Chili peppers. Onion and garlic. Horseradish. Fruits Oranges, grapefruit, and lemon (fruit and juice). Meats High-fat meats, fish, and poultry. This includes hot dogs, ribs, ham, sausage, salami, and bacon. Dairy Whole milk and chocolate milk. Sour cream. Cream. Butter. Ice cream. Cream cheese.  Drinks Coffee and tea. Bubbly (carbonated) drinks or energy drinks. Condiments Hot sauce. Barbecue sauce.  Sweets/Desserts Chocolate and cocoa. Donuts. Peppermint and spearmint. Fats and Oils High-fat foods. This includes [place] fries and potato chips. Other Vinegar. Strong spices. This includes black pepper, white pepper, red pepper, cayenne, curry powder, cloves, ginger, and chili powder. The items listed above may not be a complete list of foods and drinks  to avoid. Contact your dietitian for more information.   This information is not intended to replace advice given to you by your health care provider. Make sure you discuss any questions you have with your health care provider.   Document Released: 03/27/2012 Document Revised: 10/17/2014 Document Reviewed: 07/31/2013 Elsevier Interactive Patient Education [employer]

## 2015-11-24 NOTE — Progress Notes (Signed)
Pre visit review using our clinic review tool, if applicable. No additional management support is needed unless otherwise documented below in the visit note. 

## 2015-11-29 ENCOUNTER — Encounter: Payer: Self-pay | Admitting: Family Medicine

## 2015-11-29 NOTE — Assessment & Plan Note (Signed)
RRR today 

## 2015-11-29 NOTE — Assessment & Plan Note (Signed)
hgba1c acceptable, minimize simple carbs. Increase exercise as tolerated. Continue current meds 

## 2015-11-29 NOTE — Assessment & Plan Note (Signed)
Given a small amount of Alprazolam to use prn

## 2015-11-29 NOTE — Assessment & Plan Note (Signed)
No recent episodes since he stopped his aggressive work out regimens. He is awaiting electrophysiology work up still due to palpitations but is experiencing less of them at this time.

## 2015-11-29 NOTE — Assessment & Plan Note (Signed)
Tolerating statin, encouraged heart healthy diet, avoid trans fats, minimize simple carbs and saturated fats. Increase exercise as tolerated 
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
## 2015-12-01 ENCOUNTER — Ambulatory Visit: Payer: Federal, State, Local not specified - PPO | Admitting: Physical Therapy

## 2015-12-01 ENCOUNTER — Ambulatory Visit (INDEPENDENT_AMBULATORY_CARE_PROVIDER_SITE_OTHER): Payer: Federal, State, Local not specified - PPO | Admitting: Psychology

## 2015-12-01 DIAGNOSIS — M5412 Radiculopathy, cervical region: Secondary | ICD-10-CM

## 2015-12-01 DIAGNOSIS — M6281 Muscle weakness (generalized): Secondary | ICD-10-CM

## 2015-12-01 DIAGNOSIS — F411 Generalized anxiety disorder: Secondary | ICD-10-CM

## 2015-12-01 DIAGNOSIS — M542 Cervicalgia: Secondary | ICD-10-CM

## 2015-12-01 MED FILL — ALPRAZolam 0.25 MG TABS: 0.25 | 5 days supply | Qty: 10 | Fill #0
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
## 2015-12-07 ENCOUNTER — Ambulatory Visit: Payer: Federal, State, Local not specified - PPO

## 2015-12-07 DIAGNOSIS — M6281 Muscle weakness (generalized): Secondary | ICD-10-CM

## 2015-12-07 DIAGNOSIS — M542 Cervicalgia: Secondary | ICD-10-CM | POA: Diagnosis not present

## 2015-12-07 DIAGNOSIS — M5412 Radiculopathy, cervical region: Secondary | ICD-10-CM
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
## 2015-12-10 ENCOUNTER — Ambulatory Visit: Payer: Federal, State, Local not specified - PPO

## 2015-12-12 ENCOUNTER — Ambulatory Visit (HOSPITAL_BASED_OUTPATIENT_CLINIC_OR_DEPARTMENT_OTHER): Payer: Federal, State, Local not specified - PPO | Attending: Family Medicine

## 2015-12-12 VITALS — Ht 69.0 in | Wt 175.0 lb

## 2015-12-12 DIAGNOSIS — I499 Cardiac arrhythmia, unspecified: Secondary | ICD-10-CM | POA: Diagnosis not present

## 2015-12-12 DIAGNOSIS — R0683 Snoring: Secondary | ICD-10-CM | POA: Diagnosis present

## 2015-12-12 DIAGNOSIS — G4733 Obstructive sleep apnea (adult) (pediatric): Secondary | ICD-10-CM | POA: Diagnosis not present

## 2015-12-12 DIAGNOSIS — G479 Sleep disorder, unspecified: Secondary | ICD-10-CM | POA: Diagnosis not present

## 2015-12-14 ENCOUNTER — Ambulatory Visit: Payer: Federal, State, Local not specified - PPO | Attending: Neurosurgery | Admitting: Physical Therapy

## 2015-12-14 DIAGNOSIS — M6281 Muscle weakness (generalized): Secondary | ICD-10-CM | POA: Diagnosis present

## 2015-12-14 DIAGNOSIS — M5412 Radiculopathy, cervical region: Secondary | ICD-10-CM | POA: Diagnosis present

## 2015-12-14 DIAGNOSIS — M542 Cervicalgia: Secondary | ICD-10-CM | POA: Diagnosis present
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
## 2015-12-15 DIAGNOSIS — R0683 Snoring: Secondary | ICD-10-CM | POA: Diagnosis not present
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
## 2015-12-15 NOTE — Progress Notes (Signed)
Patient Name: Darren Salazar, [name] Date: 12/12/2015 Gender: Male D.O.B: May 02, 1971 Age (years): 44 Referring Provider: Bradd CanaryStacey A Blyth Height (inches): 69 Interpreting Physician: Cyril Mourningakesh Chelsey Kimberley MD, ABSM Weight (lbs): 175 RPSGT: Melburn PopperWillard, Susan BMI: 26 MRN: [id number] Neck Size: 14.00   CLINICAL INFORMATION Sleep Study Type: NPSG Indication for sleep study: Snoring Epworth Sleepiness Score: 9   SLEEP STUDY TECHNIQUE As per the AASM Manual for the Scoring of Sleep and Associated Events v2.3 (April 2016) with a hypopnea requiring 4% desaturations. The channels recorded and monitored were frontal, central and occipital EEG, electrooculogram (EOG), submentalis EMG (chin), nasal and oral airflow, thoracic and abdominal wall motion, anterior tibialis EMG, snore microphone, electrocardiogram, and pulse oximetry.   MEDICATIONS Medications self-administered by patient during sleep study : No sleep medicine administered.   SLEEP ARCHITECTURE The study was initiated at 9:43:34 PM and ended at 4:32:36 AM. Sleep onset time was 35.1 minutes and the sleep efficiency was 74.5%. The total sleep time was 304.9 minutes. Stage REM latency was 75.0 minutes. The patient spent 10.50% of the night in stage N1 sleep, 71.14% in stage N2 sleep, 0.00% in stage N3 and 18.37% in REM. Alpha intrusion was absent. Supine sleep was 74.42%.   RESPIRATORY PARAMETERS The overall apnea/hypopnea index (AHI) was 1.4 per hour. There were 2 total apneas, including 0 obstructive, 2 central and 0 mixed apneas. There were 5 hypopneas and 4 RERAs. The AHI during Stage REM sleep was 4.3 per hour. AHI while supine was 1.9 per hour. The mean oxygen saturation was 95.13%. The minimum SpO2 during sleep was 92.00%. Soft snoring was noted during this study.   CARDIAC DATA The 2 lead EKG demonstrated sinus rhythm. The mean heart rate was 62.73 beats per minute. Other EKG findings include: None.   LEG MOVEMENT DATA The total  PLMS were 0 with a resulting PLMS index of 0.00. Associated arousal with leg movement index was 0.0 .   IMPRESSIONS - No significant obstructive sleep apnea occurred during this study (AHI = 1.4/h). - No significant central sleep apnea occurred during this study (CAI = 0.4/h). - The patient had minimal or no oxygen desaturation during the study (Min O2 = 92.00%) - The patient snored with Soft snoring volume. - No cardiac abnormalities were noted during this study. - Clinically significant periodic limb movements did not occur during sleep. No significant associated arousals.   DIAGNOSIS - Snoring   RECOMMENDATIONS - Avoid alcohol, sedatives and other CNS depressants that may worsen sleep apnea and disrupt normal sleep architecture. - Sleep hygiene should be reviewed to assess factors that may improve sleep quality. - Weight management and regular exercise should be initiated or continued if appropriate.  Cyril Mourningakesh Lochlyn Zullo MD. Tonny BollmanFCCP. [facility]

## 2015-12-16 ENCOUNTER — Encounter: Payer: Self-pay | Admitting: Cardiology

## 2015-12-16 ENCOUNTER — Ambulatory Visit (INDEPENDENT_AMBULATORY_CARE_PROVIDER_SITE_OTHER): Payer: Federal, State, Local not specified - PPO | Admitting: Cardiology

## 2015-12-16 VITALS — BP 122/72 | HR 80 | Ht 69.0 in | Wt 172.0 lb

## 2015-12-16 DIAGNOSIS — I10 Essential (primary) hypertension: Secondary | ICD-10-CM

## 2015-12-16 DIAGNOSIS — R000 Tachycardia, unspecified: Secondary | ICD-10-CM | POA: Diagnosis not present

## 2015-12-16 DIAGNOSIS — I43 Cardiomyopathy in diseases classified elsewhere: Secondary | ICD-10-CM | POA: Diagnosis not present

## 2015-12-16 MED ORDER — NEBIVOLOL HCL 5 MG PO TABS: 5.0000 mg | ORAL_TABLET | Freq: Every day | ORAL | Status: DC

## 2015-12-16 NOTE — Patient Instructions (Addendum)
Medication Instructions:   STOP METOPROLOL NOW  START BYSTOLIC 5 MG ONCE DAILY    Follow-Up:  2 MONTHS WITH DR Delton SeeNELSON      If you need a refill on your cardiac medications before your next appointment, please call your pharmacy.

## 2015-12-17 ENCOUNTER — Telehealth: Payer: Self-pay | Admitting: *Deleted

## 2015-12-17 ENCOUNTER — Ambulatory Visit: Payer: Federal, State, Local not specified - PPO

## 2015-12-17 ENCOUNTER — Encounter: Payer: Self-pay | Admitting: *Deleted

## 2015-12-17 NOTE — Telephone Encounter (Signed)
Pre-Visit Call completed with patient and chart updated.   Pre-Visit Info documented in Specialty Comments under [employer].    

## 2015-12-18 ENCOUNTER — Ambulatory Visit (INDEPENDENT_AMBULATORY_CARE_PROVIDER_SITE_OTHER): Payer: Federal, State, Local not specified - PPO | Admitting: Family Medicine

## 2015-12-18 ENCOUNTER — Telehealth: Payer: Self-pay | Admitting: Family Medicine

## 2015-12-18 ENCOUNTER — Encounter: Payer: Self-pay | Admitting: Family Medicine

## 2015-12-18 ENCOUNTER — Ambulatory Visit (INDEPENDENT_AMBULATORY_CARE_PROVIDER_SITE_OTHER): Payer: Federal, State, Local not specified - PPO | Admitting: Psychology

## 2015-12-18 VITALS — BP 126/80 | HR 63 | Temp 98.1°F | Ht 69.0 in | Wt 174.1 lb

## 2015-12-18 DIAGNOSIS — R000 Tachycardia, unspecified: Secondary | ICD-10-CM

## 2015-12-18 DIAGNOSIS — M25561 Pain in right knee: Secondary | ICD-10-CM

## 2015-12-18 DIAGNOSIS — I517 Cardiomegaly: Secondary | ICD-10-CM | POA: Diagnosis not present

## 2015-12-18 DIAGNOSIS — F411 Generalized anxiety disorder: Secondary | ICD-10-CM

## 2015-12-18 DIAGNOSIS — Z0000 Encounter for general adult medical examination without abnormal findings: Secondary | ICD-10-CM | POA: Diagnosis not present

## 2015-12-18 DIAGNOSIS — E119 Type 2 diabetes mellitus without complications: Secondary | ICD-10-CM | POA: Diagnosis not present

## 2015-12-18 DIAGNOSIS — E782 Mixed hyperlipidemia: Secondary | ICD-10-CM

## 2015-12-18 DIAGNOSIS — R102 Pelvic and perineal pain: Secondary | ICD-10-CM

## 2015-12-18 MED ORDER — OSELTAMIVIR PHOSPHATE 75 MG PO CAPS: 75.0000 mg | ORAL_CAPSULE | Freq: Every day | ORAL | Status: DC

## 2015-12-18 NOTE — Assessment & Plan Note (Signed)
hgba1c acceptable, minimize simple carbs. Increase exercise as tolerated. Continue current meds 
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
## 2015-12-18 NOTE — Telephone Encounter (Signed)
Tamiflu 75 mg tabs 1 tab po qd x 10 days can only treat my patients

## 2015-12-18 NOTE — Telephone Encounter (Signed)
Sent in script and pt. Informed of PCP instructions.

## 2015-12-18 NOTE — Patient Instructions (Addendum)
Nasal Saline otc flush nose twice a day. Zyrtec or Claritin for a couple week. If dryness in eyes worse need to see ophthalmologist.. [facility]ropractor Lady [place], [facility]. Send the brand name of the glucose meter through mychart.  Preventive Care for Adults, Male A healthy lifestyle and preventive care can promote health and wellness. Preventive health guidelines for men include the following key practices:  A routine yearly physical is a good way to check with your health care provider about your health and preventative screening. It is a chance to share any concerns and updates on your health and to receive a thorough exam.  Visit your dentist for a routine exam and preventative care every 6 months. Brush your teeth twice a day and floss once a day. Good oral hygiene prevents tooth decay and gum disease.  The frequency of eye exams is based on your age, health, family medical history, use of contact lenses, and other factors. Follow your health care provider's recommendations for frequency of eye exams.  Eat a healthy diet. Foods such as vegetables, fruits, whole grains, low-fat dairy products, and lean protein foods contain the nutrients you need without too many calories. Decrease your intake of foods high in solid fats, added sugars, and salt. Eat the right amount of calories for you.Get information about a proper diet from your health care provider, if necessary.  Regular physical exercise is one of the most important things you can do for your health. Most adults should get at least 150 minutes of moderate-intensity exercise (any activity that increases your heart rate and causes you to sweat) each week. In addition, most adults need muscle-strengthening exercises on 2 or more days a week.  Maintain a healthy weight. The body mass index (BMI) is a screening tool to identify possible weight problems. It provides an estimate of body fat based on height  and weight. Your health care provider can find your BMI and can help you achieve or maintain a healthy weight.For adults 20 years and older:  A BMI below 18.5 is considered underweight.  A BMI of 18.5 to 24.9 is normal.  A BMI of 25 to 29.9 is considered overweight.  A BMI of 30 and above is considered obese.  Maintain normal blood lipids and cholesterol levels by exercising and minimizing your intake of saturated fat. Eat a balanced diet with plenty of fruit and vegetables. Blood tests for lipids and cholesterol should begin at age 72 and be repeated every 5 years. If your lipid or cholesterol levels are high, you are over 50, or you are at high risk for heart disease, you may need your cholesterol levels checked more frequently.Ongoing high lipid and cholesterol levels should be treated with medicines if diet and exercise are not working.  If you smoke, find out from your health care provider how to quit. If you do not use tobacco, do not start.  Lung cancer screening is recommended for adults aged 58-80 years who are at high risk for developing lung cancer because of a history of smoking. A yearly low-dose CT scan of the lungs is recommended for people who have at least a 30-pack-year history of smoking and are a current smoker or have quit within the past 15 years. A pack year of smoking is smoking an average of 1 pack of cigarettes a day for 1 year (for example: 1 pack a day for 30 years or 2 packs a day for 15 years). Yearly screening  should continue until the smoker has stopped smoking for at least 15 years. Yearly screening should be stopped for people who develop a health problem that would prevent them from having lung cancer treatment.  If you choose to drink alcohol, do not have more than 2 drinks per day. One drink is considered to be 12 ounces (355 mL) of beer, 5 ounces (148 mL) of wine, or 1.5 ounces (44 mL) of liquor.  Avoid use of street drugs. Do not share needles with anyone.  Ask for help if you need support or instructions about stopping the use of drugs.  High blood pressure causes heart disease and increases the risk of stroke. Your blood pressure should be checked at least every 1-2 years. Ongoing high blood pressure should be treated with medicines, if weight loss and exercise are not effective.  If you are 60-78 years old, ask your health care provider if you should take aspirin to prevent heart disease.  Diabetes screening is done by taking a blood sample to check your blood glucose level after you have not eaten for a certain period of time (fasting). If you are not overweight and you do not have risk factors for diabetes, you should be screened once every 3 years starting at age 3. If you are overweight or obese and you are 40-43 years of age, you should be screened for diabetes every year as part of your cardiovascular risk assessment.  Colorectal cancer can be detected and often prevented. Most routine colorectal cancer screening begins at the age of 69 and continues through age 67. However, your health care provider may recommend screening at an earlier age if you have risk factors for colon cancer. On a yearly basis, your health care provider may provide home test kits to check for hidden blood in the stool. Use of a small camera at the end of a tube to directly examine the colon (sigmoidoscopy or colonoscopy) can detect the earliest forms of colorectal cancer. Talk to your health care provider about this at age 68, when routine screening begins. Direct exam of the colon should be repeated every 5-10 years through age 89, unless early forms of precancerous polyps or small growths are found.  People who are at an increased risk for hepatitis B should be screened for this virus. You are considered at high risk for hepatitis B if:  You were born in a country where hepatitis B occurs often. Talk with your health care provider about which countries are considered  high risk.  Your parents were born in a high-risk country and you have not received a shot to protect against hepatitis B (hepatitis B vaccine).  You have HIV or AIDS.  You use needles to inject street drugs.  You live with, or have sex with, someone who has hepatitis B.  You are a man who has sex with other men (MSM).  You get hemodialysis treatment.  You take certain medicines for conditions such as cancer, organ transplantation, and autoimmune conditions.  Hepatitis C blood testing is recommended for all people born from [birth year] through 1965 and any individual with known risks for hepatitis C.  Practice safe sex. Use condoms and avoid high-risk sexual practices to reduce the spread of sexually transmitted infections (STIs). STIs include gonorrhea, chlamydia, syphilis, trichomonas, herpes, HPV, and human immunodeficiency virus (HIV). Herpes, HIV, and HPV are viral illnesses that have no cure. They can result in disability, cancer, and death.  If you are a man who has  sex with other men, you should be screened at least once per year for:  HIV.  Urethral, rectal, and pharyngeal infection of gonorrhea, chlamydia, or both.  If you are at risk of being infected with HIV, it is recommended that you take a prescription medicine daily to prevent HIV infection. This is called preexposure prophylaxis (PrEP). You are considered at risk if:  You are a man who has sex with other men (MSM) and have other risk factors.  You are a heterosexual man, are sexually active, and are at increased risk for HIV infection.  You take drugs by injection.  You are sexually active with a partner who has HIV.  Talk with your health care provider about whether you are at high risk of being infected with HIV. If you choose to begin PrEP, you should first be tested for HIV. You should then be tested every 3 months for as long as you are taking PrEP.  A one-time screening for abdominal aortic aneurysm (AAA) and  surgical repair of large AAAs by ultrasound are recommended for men ages 31 to 34 years who are current or former smokers.  Healthy men should no longer receive prostate-specific antigen (PSA) blood tests as part of routine cancer screening. Talk with your health care provider about prostate cancer screening.  Testicular cancer screening is not recommended for adult males who have no symptoms. Screening includes self-exam, a health care provider exam, and other screening tests. Consult with your health care provider about any symptoms you have or any concerns you have about testicular cancer.  Use sunscreen. Apply sunscreen liberally and repeatedly throughout the day. You should seek shade when your shadow is shorter than you. Protect yourself by wearing long sleeves, pants, a wide-brimmed hat, and sunglasses year round, whenever you are outdoors.  Once a month, do a whole-body skin exam, using a mirror to look at the skin on your back. Tell your health care provider about new moles, moles that have irregular borders, moles that are larger than a pencil eraser, or moles that have changed in shape or color.  Stay current with required vaccines (immunizations).  Influenza vaccine. All adults should be immunized every year.  Tetanus, diphtheria, and acellular pertussis (Td, Tdap) vaccine. An adult who has not previously received Tdap or who does not know his vaccine status should receive 1 dose of Tdap. This initial dose should be followed by tetanus and diphtheria toxoids (Td) booster doses every 10 years. Adults with an unknown or incomplete history of completing a 3-dose immunization series with Td-containing vaccines should begin or complete a primary immunization series including a Tdap dose. Adults should receive a Td booster every 10 years.  Varicella vaccine. An adult without evidence of immunity to varicella should receive 2 doses or a second dose if he has previously received 1 dose.  Human  papillomavirus (HPV) vaccine. Males aged 11-21 years who have not received the vaccine previously should receive the 3-dose series. Males aged 22-26 years may be immunized. Immunization is recommended through the age of 20 years for any male who has sex with males and did not get any or all doses earlier. Immunization is recommended for any person with an immunocompromised condition through the age of 86 years if he did not get any or all doses earlier. During the 3-dose series, the second dose should be obtained 4-8 weeks after the first dose. The third dose should be obtained 24 weeks after the first dose and 16 weeks after  the second dose.  Zoster vaccine. One dose is recommended for adults aged 48 years or older unless certain conditions are present.  Measles, mumps, and rubella (MMR) vaccine. Adults born before [birth year] generally are considered immune to measles and mumps. Adults born in [birth year] or later should have 1 or more doses of MMR vaccine unless there is a contraindication to the vaccine or there is laboratory evidence of immunity to each of the three diseases. A routine second dose of MMR vaccine should be obtained at least 28 days after the first dose for students attending postsecondary schools, health care workers, or international travelers. People who received inactivated measles vaccine or an unknown type of measles vaccine during 1963-1967 should receive 2 doses of MMR vaccine. People who received inactivated mumps vaccine or an unknown type of mumps vaccine before 1979 and are at high risk for mumps infection should consider immunization with 2 doses of MMR vaccine. Unvaccinated health care workers born before [birth year] who lack laboratory evidence of measles, mumps, or rubella immunity or laboratory confirmation of disease should consider measles and mumps immunization with 2 doses of MMR vaccine or rubella immunization with 1 dose of MMR vaccine.  Pneumococcal 13-valent conjugate (PCV13) vaccine.  When indicated, a person who is uncertain of his immunization history and has no record of immunization should receive the PCV13 vaccine. All adults 38 years of age and older should receive this vaccine. An adult aged 64 years or older who has certain medical conditions and has not been previously immunized should receive 1 dose of PCV13 vaccine. This PCV13 should be followed with a dose of pneumococcal polysaccharide (PPSV23) vaccine. Adults who are at high risk for pneumococcal disease should obtain the PPSV23 vaccine at least 8 weeks after the dose of PCV13 vaccine. Adults older than 43 years of age who have normal immune system function should obtain the PPSV23 vaccine dose at least 1 year after the dose of PCV13 vaccine.  Pneumococcal polysaccharide (PPSV23) vaccine. When PCV13 is also indicated, PCV13 should be obtained first. All adults aged 68 years and older should be immunized. An adult younger than age 61 years who has certain medical conditions should be immunized. Any person who resides in a nursing home or long-term care facility should be immunized. An adult smoker should be immunized. People with an immunocompromised condition and certain other conditions should receive both PCV13 and PPSV23 vaccines. People with human immunodeficiency virus (HIV) infection should be immunized as soon as possible after diagnosis. Immunization during chemotherapy or radiation therapy should be avoided. Routine use of PPSV23 vaccine is not recommended for American Indians, [place] Natives, or people younger than 65 years unless there are medical conditions that require PPSV23 vaccine. When indicated, people who have unknown immunization and have no record of immunization should receive PPSV23 vaccine. One-time revaccination 5 years after the first dose of PPSV23 is recommended for people aged 19-64 years who have chronic kidney failure, nephrotic syndrome, asplenia, or immunocompromised conditions. People who received  1-2 doses of PPSV23 before age 78 years should receive another dose of PPSV23 vaccine at age 70 years or later if at least 5 years have passed since the previous dose. Doses of PPSV23 are not needed for people immunized with PPSV23 at or after age 49 years.  Meningococcal vaccine. Adults with asplenia or persistent complement component deficiencies should receive 2 doses of quadrivalent meningococcal conjugate (MenACWY-D) vaccine. The doses should be obtained at least 2 months apart. Microbiologists working with certain meningococcal bacteria,  [place] recruits, people at risk during an outbreak, and people who travel to or live in countries with a high rate of meningitis should be immunized. A first-year college student up through age 55 years who is living in a residence hall should receive a dose if he did not receive a dose on or after his 16th birthday. Adults who have certain high-risk conditions should receive one or more doses of vaccine.  Hepatitis A vaccine. Adults who wish to be protected from this disease, have chronic liver disease, work with hepatitis A-infected animals, work in hepatitis A research labs, or travel to or work in countries with a high rate of hepatitis A should be immunized. Adults who were previously unvaccinated and who anticipate close contact with an international adoptee during the first 60 days after arrival in the [place] from a country with a high rate of hepatitis A should be immunized.  Hepatitis B vaccine. Adults should be immunized if they wish to be protected from this disease, are under age 91 years and have diabetes, have chronic liver disease, have had more than one sex partner in the past 6 months, may be exposed to blood or other infectious body fluids, are household contacts or sex partners of hepatitis B positive people, are clients or workers in certain care facilities, or travel to or work in countries with a high rate of hepatitis B.  Haemophilus  influenzae type b (Hib) vaccine. A previously unvaccinated person with asplenia or sickle cell disease or having a scheduled splenectomy should receive 1 dose of Hib vaccine. Regardless of previous immunization, a recipient of a hematopoietic stem cell transplant should receive a 3-dose series 6-12 months after his successful transplant. Hib vaccine is not recommended for adults with HIV infection. Preventive Service / Frequency Ages 22 to 83  Blood pressure check.** / Every 3-5 years.  Lipid and cholesterol check.** / Every 5 years beginning at age 69.  Hepatitis C blood test.** / For any individual with known risks for hepatitis C.  Skin self-exam. / Monthly.  Influenza vaccine. / Every year.  Tetanus, diphtheria, and acellular pertussis (Tdap, Td) vaccine.** / Consult your health care provider. 1 dose of Td every 10 years.  Varicella vaccine.** / Consult your health care provider.  HPV vaccine. / 3 doses over 6 months, if 91 or younger.  Measles, mumps, rubella (MMR) vaccine.** / You need at least 1 dose of MMR if you were born in 1957 or later. You may also need a second dose.  Pneumococcal 13-valent conjugate (PCV13) vaccine.** / Consult your health care provider.  Pneumococcal polysaccharide (PPSV23) vaccine.** / 1 to 2 doses if you smoke cigarettes or if you have certain conditions.  Meningococcal vaccine.** / 1 dose if you are age 14 to 62 years and a Market researcher living in a residence hall, or have one of several medical conditions. You may also need additional booster doses.  Hepatitis A vaccine.** / Consult your health care provider.  Hepatitis B vaccine.** / Consult your health care provider.  Haemophilus influenzae type b (Hib) vaccine.** / Consult your health care provider. Ages 38 to 74  Blood pressure check.** / Every year.  Lipid and cholesterol check.** / Every 5 years beginning at age 40.  Lung cancer screening. / Every year if you are aged  35-80 years and have a 30-pack-year history of smoking and currently smoke or have quit within the past 15 years. Yearly screening is stopped once you have quit smoking  for at least 15 years or develop a health problem that would prevent you from having lung cancer treatment.  Fecal occult blood test (FOBT) of stool. / Every year beginning at age 41 and continuing until age 39. You may not have to do this test if you get a colonoscopy every 10 years.  Flexible sigmoidoscopy** or colonoscopy.** / Every 5 years for a flexible sigmoidoscopy or every 10 years for a colonoscopy beginning at age 35 and continuing until age 88.  Hepatitis C blood test.** / For all people born from [birth year] through 1965 and any individual with known risks for hepatitis C.  Skin self-exam. / Monthly.  Influenza vaccine. / Every year.  Tetanus, diphtheria, and acellular pertussis (Tdap/Td) vaccine.** / Consult your health care provider. 1 dose of Td every 10 years.  Varicella vaccine.** / Consult your health care provider.  Zoster vaccine.** / 1 dose for adults aged 31 years or older.  Measles, mumps, rubella (MMR) vaccine.** / You need at least 1 dose of MMR if you were born in 1955 or later. You may also need a second dose.  Pneumococcal 13-valent conjugate (PCV13) vaccine.** / Consult your health care provider.  Pneumococcal polysaccharide (PPSV23) vaccine.** / 1 to 2 doses if you smoke cigarettes or if you have certain conditions.  Meningococcal vaccine.** / Consult your health care provider.  Hepatitis A vaccine.** / Consult your health care provider.  Hepatitis B vaccine.** / Consult your health care provider.  Haemophilus influenzae type b (Hib) vaccine.** / Consult your health care provider. Ages 16 and over  Blood pressure check.** / Every year.  Lipid and cholesterol check.**/ Every 5 years beginning at age 23.  Lung cancer screening. / Every year if you are aged 55-80 years and have a 30-pack-year  history of smoking and currently smoke or have quit within the past 15 years. Yearly screening is stopped once you have quit smoking for at least 15 years or develop a health problem that would prevent you from having lung cancer treatment.  Fecal occult blood test (FOBT) of stool. / Every year beginning at age 74 and continuing until age 60. You may not have to do this test if you get a colonoscopy every 10 years.  Flexible sigmoidoscopy** or colonoscopy.** / Every 5 years for a flexible sigmoidoscopy or every 10 years for a colonoscopy beginning at age 2 and continuing until age 50.  Hepatitis C blood test.** / For all people born from [birth year] through 1963 and any individual with known risks for hepatitis C.  Abdominal aortic aneurysm (AAA) screening.** / A one-time screening for ages 39 to 46 years who are current or former smokers.  Skin self-exam. / Monthly.  Influenza vaccine. / Every year.  Tetanus, diphtheria, and acellular pertussis (Tdap/Td) vaccine.** / 1 dose of Td every 10 years.  Varicella vaccine.** / Consult your health care provider.  Zoster vaccine.** / 1 dose for adults aged 40 years or older.  Pneumococcal 13-valent conjugate (PCV13) vaccine.** / 1 dose for all adults aged 45 years and older.  Pneumococcal polysaccharide (PPSV23) vaccine.** / 1 dose for all adults aged 12 years and older.  Meningococcal vaccine.** / Consult your health care provider.  Hepatitis A vaccine.** / Consult your health care provider.  Hepatitis B vaccine.** / Consult your health care provider.  Haemophilus influenzae type b (Hib) vaccine.** / Consult your health care provider. **Family history and personal history of risk and conditions may change your health care provider's recommendations.  This information is not intended to replace advice given to you by your health care provider. Make sure you discuss any questions you have with your health care provider.   Document Released:  11/22/2001 Document Revised: 10/17/2014 Document Reviewed: 02/21/2011 Elsevier Interactive Patient Education [employer].

## 2015-12-18 NOTE — Telephone Encounter (Signed)
Caller name:Raven Relationship to patient:self  Can be reached:[phone] Pharmacy:[facility] [address]   Reason for call:child has the flu  pediatrition advised him to call pcp and ask for tamiflu for the remainder of the family  That would be 4 people

## 2015-12-18 NOTE — Assessment & Plan Note (Addendum)
Doing better with changes in exercise and treatments

## 2015-12-18 NOTE — Progress Notes (Signed)
Pre visit review using our clinic review tool, if applicable. No additional management support is needed unless otherwise documented below in the visit note. 

## 2015-12-20 ENCOUNTER — Encounter: Payer: Self-pay | Admitting: Family Medicine

## 2015-12-21 ENCOUNTER — Other Ambulatory Visit: Payer: Self-pay | Admitting: Family Medicine

## 2015-12-21 ENCOUNTER — Ambulatory Visit: Payer: Federal, State, Local not specified - PPO | Admitting: Physical Therapy

## 2015-12-21 DIAGNOSIS — M542 Cervicalgia: Secondary | ICD-10-CM | POA: Diagnosis not present

## 2015-12-21 DIAGNOSIS — M6281 Muscle weakness (generalized): Secondary | ICD-10-CM

## 2015-12-21 DIAGNOSIS — M5412 Radiculopathy, cervical region: Secondary | ICD-10-CM

## 2015-12-21 MED ORDER — GLUCOSE BLOOD VI STRP: ORAL_STRIP | Status: DC

## 2015-12-21 MED ORDER — ONETOUCH LANCETS MISC: Status: AC
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
## 2015-12-24 ENCOUNTER — Ambulatory Visit: Payer: Federal, State, Local not specified - PPO

## 2015-12-27 NOTE — Assessment & Plan Note (Signed)
Minimal at this time.

## 2015-12-28 ENCOUNTER — Ambulatory Visit (INDEPENDENT_AMBULATORY_CARE_PROVIDER_SITE_OTHER): Payer: Federal, State, Local not specified - PPO | Admitting: Internal Medicine

## 2015-12-28 ENCOUNTER — Encounter: Payer: Self-pay | Admitting: Internal Medicine

## 2015-12-28 ENCOUNTER — Other Ambulatory Visit: Payer: Self-pay | Admitting: Family Medicine

## 2015-12-28 ENCOUNTER — Ambulatory Visit: Payer: Federal, State, Local not specified - PPO | Admitting: Physical Therapy

## 2015-12-28 VITALS — BP 136/80 | HR 104 | Ht 69.0 in | Wt 176.0 lb

## 2015-12-28 DIAGNOSIS — M542 Cervicalgia: Secondary | ICD-10-CM | POA: Diagnosis not present

## 2015-12-28 DIAGNOSIS — R000 Tachycardia, unspecified: Secondary | ICD-10-CM | POA: Diagnosis not present

## 2015-12-28 DIAGNOSIS — I10 Essential (primary) hypertension: Secondary | ICD-10-CM | POA: Diagnosis not present

## 2015-12-28 DIAGNOSIS — M6281 Muscle weakness (generalized): Secondary | ICD-10-CM

## 2015-12-28 DIAGNOSIS — M5412 Radiculopathy, cervical region: Secondary | ICD-10-CM

## 2015-12-28 MED ORDER — LISINOPRIL 20 MG PO TABS: 20.0000 mg | ORAL_TABLET | Freq: Every day | ORAL | Status: DC
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
## 2015-12-28 NOTE — Patient Instructions (Signed)
Medication Instructions:  Your physician has recommended you make the following change in your medication:  1) Stop Lisinopril/HCT 2) Start Lisinopril 20 mg daily   Labwork: None ordered   Testing/Procedures: None ordered   Follow-Up: Your physician recommends that you schedule a follow-up appointment as needed   Any Other Special Instructions Will Be Listed Below (If Applicable).     If you need a refill on your cardiac medications before your next appointment, please call your pharmacy.
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
## 2015-12-30 NOTE — Assessment & Plan Note (Signed)
Patient encouraged to maintain heart healthy diet, regular exercise, adequate sleep. Consider daily probiotics. Take medications as prescribed. Given and reviewed copy of ACP documents from [place] Secretary of State and encouraged to complete and return. Labs reviewed.  

## 2015-12-30 NOTE — Addendum Note (Signed)
Addended by: Danise EdgeBLYTH, STACEY A on: 12/30/2015 12:55 PM   Modules accepted: Kipp BroodSmartSet

## 2015-12-31 ENCOUNTER — Ambulatory Visit: Payer: Federal, State, Local not specified - PPO

## 2016-01-04 ENCOUNTER — Other Ambulatory Visit: Payer: Self-pay | Admitting: Family Medicine

## 2016-01-04 ENCOUNTER — Ambulatory Visit: Payer: Federal, State, Local not specified - PPO

## 2016-01-05 ENCOUNTER — Ambulatory Visit (INDEPENDENT_AMBULATORY_CARE_PROVIDER_SITE_OTHER): Payer: Federal, State, Local not specified - PPO | Admitting: Psychology

## 2016-01-05 DIAGNOSIS — F411 Generalized anxiety disorder: Secondary | ICD-10-CM | POA: Diagnosis not present

## 2016-01-06 ENCOUNTER — Encounter (HOSPITAL_BASED_OUTPATIENT_CLINIC_OR_DEPARTMENT_OTHER): Payer: Self-pay

## 2016-01-07 ENCOUNTER — Ambulatory Visit: Payer: Federal, State, Local not specified - PPO | Admitting: Physical Therapy

## 2016-01-12 DIAGNOSIS — H1045 Other chronic allergic conjunctivitis: Secondary | ICD-10-CM | POA: Diagnosis not present

## 2016-01-12 DIAGNOSIS — J019 Acute sinusitis, unspecified: Secondary | ICD-10-CM | POA: Diagnosis not present

## 2016-01-18 ENCOUNTER — Ambulatory Visit: Payer: Federal, State, Local not specified - PPO | Attending: Neurosurgery | Admitting: Physical Therapy

## 2016-01-18 DIAGNOSIS — M542 Cervicalgia: Secondary | ICD-10-CM | POA: Diagnosis not present

## 2016-01-18 DIAGNOSIS — M5412 Radiculopathy, cervical region: Secondary | ICD-10-CM | POA: Diagnosis not present

## 2016-01-18 DIAGNOSIS — R29898 Other symptoms and signs involving the musculoskeletal system: Secondary | ICD-10-CM
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
## 2016-01-25 ENCOUNTER — Ambulatory Visit (INDEPENDENT_AMBULATORY_CARE_PROVIDER_SITE_OTHER): Payer: Federal, State, Local not specified - PPO | Admitting: Psychology

## 2016-01-25 DIAGNOSIS — F411 Generalized anxiety disorder: Secondary | ICD-10-CM

## 2016-01-27 ENCOUNTER — Ambulatory Visit: Payer: Federal, State, Local not specified - PPO

## 2016-01-27 DIAGNOSIS — M542 Cervicalgia: Secondary | ICD-10-CM | POA: Diagnosis not present

## 2016-01-27 DIAGNOSIS — M5412 Radiculopathy, cervical region: Secondary | ICD-10-CM

## 2016-01-27 DIAGNOSIS — R29898 Other symptoms and signs involving the musculoskeletal system: Secondary | ICD-10-CM | POA: Diagnosis not present
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
## 2016-02-02 ENCOUNTER — Ambulatory Visit: Payer: Federal, State, Local not specified - PPO

## 2016-02-02 DIAGNOSIS — M5412 Radiculopathy, cervical region: Secondary | ICD-10-CM

## 2016-02-02 DIAGNOSIS — M542 Cervicalgia: Secondary | ICD-10-CM

## 2016-02-02 DIAGNOSIS — R29898 Other symptoms and signs involving the musculoskeletal system: Secondary | ICD-10-CM
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
## 2016-02-09 ENCOUNTER — Ambulatory Visit: Payer: Federal, State, Local not specified - PPO | Admitting: Physical Therapy

## 2016-02-12 ENCOUNTER — Ambulatory Visit (INDEPENDENT_AMBULATORY_CARE_PROVIDER_SITE_OTHER): Payer: Federal, State, Local not specified - PPO | Admitting: Psychology

## 2016-02-12 DIAGNOSIS — F411 Generalized anxiety disorder: Secondary | ICD-10-CM | POA: Diagnosis not present

## 2016-02-15 ENCOUNTER — Encounter: Payer: Self-pay | Admitting: Cardiology

## 2016-02-15 ENCOUNTER — Ambulatory Visit (INDEPENDENT_AMBULATORY_CARE_PROVIDER_SITE_OTHER): Payer: Federal, State, Local not specified - PPO | Admitting: Cardiology

## 2016-02-15 VITALS — BP 102/70 | HR 84 | Ht 69.0 in | Wt 178.4 lb

## 2016-02-15 DIAGNOSIS — Z889 Allergy status to unspecified drugs, medicaments and biological substances status: Secondary | ICD-10-CM | POA: Diagnosis not present

## 2016-02-15 DIAGNOSIS — Z789 Other specified health status: Secondary | ICD-10-CM

## 2016-02-15 DIAGNOSIS — R000 Tachycardia, unspecified: Secondary | ICD-10-CM

## 2016-02-15 DIAGNOSIS — I2510 Atherosclerotic heart disease of native coronary artery without angina pectoris: Secondary | ICD-10-CM

## 2016-02-15 DIAGNOSIS — I517 Cardiomegaly: Secondary | ICD-10-CM

## 2016-02-15 DIAGNOSIS — Z9889 Other specified postprocedural states: Secondary | ICD-10-CM | POA: Diagnosis not present

## 2016-02-15 DIAGNOSIS — I5033 Acute on chronic diastolic (congestive) heart failure: Secondary | ICD-10-CM
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
## 2016-02-15 NOTE — Patient Instructions (Signed)
Your physician recommends that you continue on your current medications as directed. Please refer to the Current Medication list given to you today.     Your physician wants you to follow-up in: 6 MONTHS WITH DR NELSON You will receive a reminder letter in the mail two months in advance. If you don't receive a letter, please call our office to schedule the follow-up appointment.  

## 2016-02-16 ENCOUNTER — Ambulatory Visit: Payer: Federal, State, Local not specified - PPO

## 2016-02-22 ENCOUNTER — Ambulatory Visit: Payer: Federal, State, Local not specified - PPO | Attending: Neurosurgery

## 2016-02-22 DIAGNOSIS — R29898 Other symptoms and signs involving the musculoskeletal system: Secondary | ICD-10-CM | POA: Diagnosis not present

## 2016-02-22 DIAGNOSIS — M5412 Radiculopathy, cervical region: Secondary | ICD-10-CM | POA: Insufficient documentation

## 2016-02-22 DIAGNOSIS — M542 Cervicalgia: Secondary | ICD-10-CM | POA: Diagnosis not present
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
## 2016-02-29 ENCOUNTER — Ambulatory Visit: Payer: Federal, State, Local not specified - PPO | Admitting: Psychology

## 2016-03-03 ENCOUNTER — Ambulatory Visit: Payer: Federal, State, Local not specified - PPO | Admitting: Physical Therapy

## 2016-03-03 DIAGNOSIS — M542 Cervicalgia: Secondary | ICD-10-CM | POA: Diagnosis not present

## 2016-03-03 DIAGNOSIS — R29898 Other symptoms and signs involving the musculoskeletal system: Secondary | ICD-10-CM | POA: Diagnosis not present

## 2016-03-03 DIAGNOSIS — M5412 Radiculopathy, cervical region: Secondary | ICD-10-CM | POA: Diagnosis not present
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
## 2016-03-10 ENCOUNTER — Ambulatory Visit: Payer: Federal, State, Local not specified - PPO

## 2016-03-21 ENCOUNTER — Encounter: Payer: Self-pay | Admitting: Family Medicine

## 2016-03-21 ENCOUNTER — Ambulatory Visit (INDEPENDENT_AMBULATORY_CARE_PROVIDER_SITE_OTHER): Payer: Federal, State, Local not specified - PPO | Admitting: Family Medicine

## 2016-03-21 VITALS — BP 128/84 | HR 73 | Temp 98.3°F | Ht 69.0 in | Wt 174.4 lb

## 2016-03-21 DIAGNOSIS — I10 Essential (primary) hypertension: Secondary | ICD-10-CM

## 2016-03-21 DIAGNOSIS — R000 Tachycardia, unspecified: Secondary | ICD-10-CM | POA: Diagnosis not present

## 2016-03-21 DIAGNOSIS — E782 Mixed hyperlipidemia: Secondary | ICD-10-CM

## 2016-03-21 DIAGNOSIS — M999 Biomechanical lesion, unspecified: Secondary | ICD-10-CM

## 2016-03-21 DIAGNOSIS — E119 Type 2 diabetes mellitus without complications: Secondary | ICD-10-CM

## 2016-03-21 DIAGNOSIS — M5090 Cervical disc disorder, unspecified, unspecified cervical region: Secondary | ICD-10-CM

## 2016-03-21 DIAGNOSIS — M9901 Segmental and somatic dysfunction of cervical region: Secondary | ICD-10-CM

## 2016-03-21 LAB — COMPREHENSIVE METABOLIC PANEL
ALK PHOS: 39 U/L (ref 39–117)
ALT: 13 U/L (ref 0–53)
AST: 18 U/L (ref 0–37)
Albumin: 4.4 g/dL (ref 3.5–5.2)
BUN: 18 mg/dL (ref 6–23)
CALCIUM: 9.5 mg/dL (ref 8.4–10.5)
CO2: 24 mEq/L (ref 19–32)
Chloride: 106 mEq/L (ref 96–112)
Creatinine, Ser: 0.89 mg/dL (ref 0.40–1.50)
GFR: 119.92 mL/min (ref >60.00)
GLUCOSE: 114 mg/dL — AB (ref 70–99)
POTASSIUM: 3.7 meq/L (ref 3.5–5.1)
Sodium: 138 mEq/L (ref 135–145)
TOTAL PROTEIN: 7.2 g/dL (ref 6.0–8.3)
Total Bilirubin: 0.7 mg/dL (ref 0.2–1.2)

## 2016-03-21 LAB — LIPID PANEL
Cholesterol: 129 mg/dL (ref 0–200)
HDL: 47.1 mg/dL (ref >39.00)
LDL Cholesterol: 73 mg/dL (ref 0–99)
NONHDL: 81.54
TRIGLYCERIDES: 44 mg/dL (ref 0.0–149.0)
Total CHOL/HDL Ratio: 3
VLDL: 8.8 mg/dL (ref 0.0–40.0)

## 2016-03-21 LAB — TSH: TSH: 0.44 u[IU]/mL (ref 0.35–4.50)

## 2016-03-21 LAB — CBC
HCT: 45 % (ref 39.0–52.0)
HEMOGLOBIN: 14.9 g/dL (ref 13.0–17.0)
MCHC: 33.2 g/dL (ref 30.0–36.0)
MCV: 90.5 fl (ref 78.0–100.0)
PLATELETS: 185 10*3/uL (ref 150.0–400.0)
RBC: 4.97 Mil/uL (ref 4.22–5.81)
RDW: 12.8 % (ref 11.5–15.5)
WBC: 3.2 10*3/uL — AB (ref 4.0–10.5)

## 2016-03-21 LAB — HEMOGLOBIN A1C: HEMOGLOBIN A1C: 6.1 % (ref 4.6–6.5)

## 2016-03-21 MED ORDER — NEBIVOLOL HCL 5 MG PO TABS: 5.0000 mg | ORAL_TABLET | Freq: Every day | ORAL | Status: DC

## 2016-03-21 NOTE — Assessment & Plan Note (Addendum)
hgba1c acceptable, minimize simple carbs. Increase exercise as tolerated. Continue current meds, declines foot exam due to time constraints today, will proceed at next visit no concerns. Requests eye doctor notes. Will give Prevnar at next visit

## 2016-03-21 NOTE — Progress Notes (Signed)
Pre visit review using our clinic review tool, if applicable. No additional management support is needed unless otherwise documented below in the visit note. 

## 2016-03-21 NOTE — Patient Instructions (Signed)
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
## 2016-03-21 NOTE — Assessment & Plan Note (Signed)
Encouraged heart healthy diet, increase exercise, avoid trans fats, consider a krill oil cap daily 

## 2016-03-21 NOTE — Assessment & Plan Note (Signed)
Encouraged special neck pillow and topical treatments. Encouraged moist heat and gentle stretching as tolerated. May try NSAIDs and prescription meds as directed and report if symptoms worsen or seek immediate care

## 2016-03-22 ENCOUNTER — Telehealth: Payer: Self-pay | Admitting: Family Medicine

## 2016-03-22 NOTE — Telephone Encounter (Signed)
Error. Faxed Medical [facility] - [phone]

## 2016-03-22 NOTE — Telephone Encounter (Signed)
Faxed Medical request form to [facility] - [phone]

## 2016-03-23 NOTE — Telephone Encounter (Signed)
Forwarded to Dr. Blyth. JG//CMA  

## 2016-03-24 ENCOUNTER — Encounter: Payer: Self-pay | Admitting: Family Medicine

## 2016-03-24 DIAGNOSIS — Z3009 Encounter for other general counseling and advice on contraception: Secondary | ICD-10-CM

## 2016-03-24 NOTE — Telephone Encounter (Signed)
Referral placed and MyChart message sent to pt w/ contact info for [facility].

## 2016-03-24 NOTE — Telephone Encounter (Signed)
Please advise in Dr Blyth's absence? 

## 2016-03-24 NOTE — Telephone Encounter (Signed)
Needs a referral for urology -- for vasectomy  Give number for julie and terri

## 2016-03-25 ENCOUNTER — Other Ambulatory Visit: Payer: Self-pay | Admitting: Family Medicine

## 2016-06-24 ENCOUNTER — Ambulatory Visit (INDEPENDENT_AMBULATORY_CARE_PROVIDER_SITE_OTHER): Payer: Federal, State, Local not specified - PPO | Admitting: Family Medicine

## 2016-06-24 ENCOUNTER — Encounter: Payer: Self-pay | Admitting: Family Medicine

## 2016-06-24 VITALS — BP 118/86 | HR 69 | Temp 98.4°F | Ht 69.0 in | Wt 180.4 lb

## 2016-06-24 DIAGNOSIS — R000 Tachycardia, unspecified: Secondary | ICD-10-CM

## 2016-06-24 DIAGNOSIS — E119 Type 2 diabetes mellitus without complications: Secondary | ICD-10-CM

## 2016-06-24 DIAGNOSIS — E871 Hypo-osmolality and hyponatremia: Secondary | ICD-10-CM

## 2016-06-24 DIAGNOSIS — D708 Other neutropenia: Secondary | ICD-10-CM

## 2016-06-24 DIAGNOSIS — M5090 Cervical disc disorder, unspecified, unspecified cervical region: Secondary | ICD-10-CM

## 2016-06-24 DIAGNOSIS — I10 Essential (primary) hypertension: Secondary | ICD-10-CM

## 2016-06-24 DIAGNOSIS — D709 Neutropenia, unspecified: Secondary | ICD-10-CM

## 2016-06-24 DIAGNOSIS — E782 Mixed hyperlipidemia: Secondary | ICD-10-CM | POA: Diagnosis not present

## 2016-06-24 LAB — CBC
HEMATOCRIT: 44.4 % (ref 39.0–52.0)
Hemoglobin: 14.9 g/dL (ref 13.0–17.0)
MCHC: 33.6 g/dL (ref 30.0–36.0)
MCV: 90.4 fl (ref 78.0–100.0)
Platelets: 173 10*3/uL (ref 150.0–400.0)
RBC: 4.91 Mil/uL (ref 4.22–5.81)
RDW: 13.4 % (ref 11.5–15.5)
WBC: 3.2 10*3/uL — AB (ref 4.0–10.5)

## 2016-06-24 MED ORDER — ALPRAZOLAM 0.25 MG PO TABS: 0.2500 mg | ORAL_TABLET | Freq: Two times a day (BID) | ORAL | 0 refills | Status: DC | PRN

## 2016-06-24 NOTE — Progress Notes (Signed)
Pre visit review using our clinic review tool, if applicable. No additional management support is needed unless otherwise documented below in the visit note. 

## 2016-06-24 NOTE — Patient Instructions (Signed)

## 2016-07-03 NOTE — Assessment & Plan Note (Signed)
Tolerating statin, encouraged heart healthy diet, avoid trans fats, minimize simple carbs and saturated fats. Increase exercise as tolerated 

## 2016-07-03 NOTE — Assessment & Plan Note (Signed)
Well controlled, no changes to meds. Encouraged heart healthy diet such as the DASH diet and exercise as tolerated.  °

## 2016-07-03 NOTE — Assessment & Plan Note (Signed)
hgba1c acceptable, minimize simple carbs. Increase exercise as tolerated. Continue current meds 

## 2016-07-03 NOTE — Assessment & Plan Note (Signed)
Encouraged moist heat and gentle stretching as tolerated. May try NSAIDs and prescription meds as directed and report if symptoms worsen or seek immediate care. Doing much better lately
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
## 2016-08-02 ENCOUNTER — Encounter: Payer: Self-pay | Admitting: Family Medicine

## 2016-08-02 ENCOUNTER — Encounter: Payer: Self-pay | Admitting: *Deleted

## 2016-08-02 ENCOUNTER — Ambulatory Visit (INDEPENDENT_AMBULATORY_CARE_PROVIDER_SITE_OTHER): Payer: Federal, State, Local not specified - PPO | Admitting: Family Medicine

## 2016-08-02 VITALS — BP 132/84 | HR 59 | Wt 180.0 lb

## 2016-08-02 DIAGNOSIS — M999 Biomechanical lesion, unspecified: Secondary | ICD-10-CM | POA: Diagnosis not present

## 2016-08-02 DIAGNOSIS — M5010 Cervical disc disorder with radiculopathy, unspecified cervical region: Secondary | ICD-10-CM

## 2016-08-02 MED ORDER — PREDNISONE 50 MG PO TABS: 50.0000 mg | ORAL_TABLET | Freq: Every day | ORAL | 0 refills | Status: DC

## 2016-08-02 MED ORDER — GABAPENTIN 100 MG PO CAPS: 200.0000 mg | ORAL_CAPSULE | Freq: Every day | ORAL | 3 refills | Status: DC

## 2016-08-02 NOTE — Patient Instructions (Addendum)
Good to see you  Darren Salazar is your friend.  If pain come back in the next day start the prednisone daily for 5 days. Stop the meloxicam.  We will try to get you a standing desk.  Gabapentin 200mg  at night No lifting with hands outside peripheral vision.  See me again in 2-3 weeks to make sure we are doing well.

## 2016-08-02 NOTE — Assessment & Plan Note (Signed)
Decision today to treat with OMT was based on Physical Exam  After verbal consent patient was treated with ME, FPR techniques in cervical, rib, thoracic areas  Patient tolerated the procedure well with improvement in symptoms  Patient given exercises, stretches and lifestyle modifications  See medications in patient instructions if given  Patient will follow up in 2 weeks

## 2016-08-02 NOTE — Assessment & Plan Note (Signed)
Does have underlying arthritis. Patient is having some radicular symptoms. We'll start him again on gabapentin. We discussed prednisone for 5 days and patient will monitor blood sugars. Patient did respond well to osteopathic manipulation. Follow-up again with me 2-3 weeks. If worsening symptoms possible repeat imaging is necessary.

## 2016-08-17 ENCOUNTER — Ambulatory Visit (INDEPENDENT_AMBULATORY_CARE_PROVIDER_SITE_OTHER): Payer: Federal, State, Local not specified - PPO | Admitting: Cardiology

## 2016-08-17 ENCOUNTER — Encounter: Payer: Self-pay | Admitting: Cardiology

## 2016-08-17 VITALS — BP 126/82 | HR 71 | Ht 69.0 in | Wt 180.0 lb

## 2016-08-17 DIAGNOSIS — I517 Cardiomegaly: Secondary | ICD-10-CM

## 2016-08-17 DIAGNOSIS — I10 Essential (primary) hypertension: Secondary | ICD-10-CM

## 2016-08-17 DIAGNOSIS — I2510 Atherosclerotic heart disease of native coronary artery without angina pectoris: Secondary | ICD-10-CM

## 2016-08-17 DIAGNOSIS — Z9889 Other specified postprocedural states: Secondary | ICD-10-CM

## 2016-08-17 DIAGNOSIS — I5032 Chronic diastolic (congestive) heart failure: Secondary | ICD-10-CM

## 2016-08-17 DIAGNOSIS — E784 Other hyperlipidemia: Secondary | ICD-10-CM | POA: Diagnosis not present

## 2016-08-17 DIAGNOSIS — R000 Tachycardia, unspecified: Secondary | ICD-10-CM

## 2016-08-17 DIAGNOSIS — E7849 Other hyperlipidemia: Secondary | ICD-10-CM

## 2016-08-17 MED ORDER — NEBIVOLOL HCL 2.5 MG PO TABS: 2.5000 mg | ORAL_TABLET | Freq: Every day | ORAL | 3 refills | Status: DC
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
## 2016-08-17 NOTE — Patient Instructions (Signed)
Medication Instructions:   DECREASE YOUR BYSTOLIC TO 2.5 MG ONCE DAILY    Follow-Up:  Your physician wants you to follow-up in: 6 MONTHS WITH DR Johnell ComingsNELSON You will receive a reminder letter in the mail two months in advance. If you don't receive a letter, please call our office to schedule the follow-up appointment.      If you need a refill on your cardiac medications before your next appointment, please call your pharmacy.

## 2016-08-23 ENCOUNTER — Encounter: Payer: Self-pay | Admitting: Family Medicine
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
## 2016-08-24 ENCOUNTER — Ambulatory Visit: Payer: Federal, State, Local not specified - PPO | Admitting: Family Medicine

## 2016-08-25 ENCOUNTER — Encounter: Payer: Self-pay | Admitting: *Deleted

## 2016-09-21 ENCOUNTER — Other Ambulatory Visit: Payer: Self-pay | Admitting: Family Medicine

## 2016-09-23 ENCOUNTER — Encounter: Payer: Self-pay | Admitting: Family Medicine

## 2016-10-15 ENCOUNTER — Other Ambulatory Visit: Payer: Self-pay | Admitting: Physician Assistant

## 2016-11-07 LAB — HM DIABETES EYE EXAM

## 2016-11-16 ENCOUNTER — Encounter: Payer: Self-pay | Admitting: Family Medicine

## 2016-11-17 NOTE — Telephone Encounter (Signed)
As per My Chart message PCP had no availability therefore scheduled with Dr. Carmelia RollerWendling for 11/18/16 at 1:15pm

## 2016-11-18 ENCOUNTER — Encounter: Payer: Self-pay | Admitting: Family Medicine

## 2016-11-18 ENCOUNTER — Ambulatory Visit (INDEPENDENT_AMBULATORY_CARE_PROVIDER_SITE_OTHER): Payer: Federal, State, Local not specified - PPO | Admitting: Family Medicine

## 2016-11-18 VITALS — BP 120/70 | HR 80 | Temp 98.4°F | Ht 69.0 in | Wt 181.4 lb

## 2016-11-18 DIAGNOSIS — M791 Myalgia, unspecified site: Secondary | ICD-10-CM

## 2016-11-18 DIAGNOSIS — R195 Other fecal abnormalities: Secondary | ICD-10-CM | POA: Diagnosis not present

## 2016-11-18 LAB — CBC WITH DIFFERENTIAL/PLATELET
BASOS ABS: 0 10*3/uL (ref 0.0–0.1)
Basophils Relative: 0.4 % (ref 0.0–3.0)
EOS ABS: 0.1 10*3/uL (ref 0.0–0.7)
Eosinophils Relative: 2.5 % (ref 0.0–5.0)
HEMATOCRIT: 45.4 % (ref 39.0–52.0)
HEMOGLOBIN: 15.4 g/dL (ref 13.0–17.0)
LYMPHS PCT: 43 % (ref 12.0–46.0)
Lymphs Abs: 1.4 10*3/uL (ref 0.7–4.0)
MCHC: 33.8 g/dL (ref 30.0–36.0)
MCV: 92 fl (ref 78.0–100.0)
MONO ABS: 0.4 10*3/uL (ref 0.1–1.0)
Monocytes Relative: 11.1 % (ref 3.0–12.0)
Neutro Abs: 1.4 10*3/uL (ref 1.4–7.7)
Neutrophils Relative %: 43 % (ref 43.0–77.0)
Platelets: 189 10*3/uL (ref 150.0–400.0)
RBC: 4.94 Mil/uL (ref 4.22–5.81)
RDW: 12.9 % (ref 11.5–15.5)
WBC: 3.2 10*3/uL — AB (ref 4.0–10.5)

## 2016-11-18 LAB — VITAMIN D 25 HYDROXY (VIT D DEFICIENCY, FRACTURES): VITD: 17.11 ng/mL — AB (ref 30.00–100.00)

## 2016-11-18 LAB — COMPREHENSIVE METABOLIC PANEL
ALBUMIN: 4.6 g/dL (ref 3.5–5.2)
ALK PHOS: 52 U/L (ref 39–117)
ALT: 27 U/L (ref 0–53)
AST: 23 U/L (ref 0–37)
BILIRUBIN TOTAL: 0.9 mg/dL (ref 0.2–1.2)
BUN: 17 mg/dL (ref 6–23)
CHLORIDE: 101 meq/L (ref 96–112)
CO2: 26 mEq/L (ref 19–32)
CREATININE: 0.87 mg/dL (ref 0.40–1.50)
Calcium: 9.7 mg/dL (ref 8.4–10.5)
GFR: 122.73 mL/min (ref >60.00)
Glucose, Bld: 115 mg/dL — ABNORMAL HIGH (ref 70–99)
Potassium: 3.7 mEq/L (ref 3.5–5.1)
SODIUM: 135 meq/L (ref 135–145)
TOTAL PROTEIN: 7.6 g/dL (ref 6.0–8.3)

## 2016-11-18 LAB — SEDIMENTATION RATE: Sed Rate: 15 mm/hr (ref 0–15)

## 2016-11-18 LAB — C-REACTIVE PROTEIN: CRP: 0.1 mg/dL — ABNORMAL LOW (ref 0.5–20.0)

## 2016-11-18 NOTE — Patient Instructions (Signed)
Ibuprofen and Tylenol for pains. Keep staying well hydrated with water.   If you start having fevers, shaking, or worsening symptoms, consider seeking immediate care or let us know.

## 2016-11-18 NOTE — Progress Notes (Signed)
Chief Complaint  Patient presents with  . Achy joints    muscle pain and other symtoms-x 1 week    Subjective: Patient is a 46 y.o. male here for muscle aches and joint pain.  Pt recently returned from [place] on [place] around 2 weeks ago. After 2 days of being back in the [place], he started to notice joint pain and muscle aches. The muscle aches tend to migrate from side to side and upper to lower extremities. The same with the joint pain. They do not seem related/associated. He is also having sleep disruption and some looser stools intertwined with constipation. He states that while he did avoid the tap water, he did try the local cuisine. He is not having any URI symptoms and has not had any sick contacts. He was feeling fine while he was in [place]. He denies fevers, night sweats, shaking, weight loss, rashes or redness, weakness, numbness/tingling, urinary complaints, bleeding, vision changes, or watery diarrhea.  ROS: Const: No fevers, night sweats, or weight loss GI: +loose stools  Family History  Problem Relation Age of Onset  . Diabetes Father   . Hypertension Father   . Heart disease Father     pacer  . Hypertension Mother   . Other Neg Hx     No FH of CAD, CVA  . Alcohol abuse Maternal Grandfather   . Cancer Maternal Grandfather     liver  . Diabetes Paternal Grandmother    Past Medical History:  Diagnosis Date  . Abnormal thyroid blood test 07/09/2014  . Diabetes mellitus type II   . Headache 04/22/2013  . History of cardiovascular stress test 2009   normal  . Hypertension   . Noninfectious gastroenteritis and colitis 04/19/2015  . Preventative health care 07/06/2013  . Sinus tachycardia   . Snoring 06/28/2015   Allergies  Allergen Reactions  . Bee Venom Anaphylaxis    Anaphylaxis     Current Outpatient Prescriptions:  .  ALPRAZolam (XANAX) 0.25 MG tablet, Take 1 tablet (0.25 mg total) by mouth 2 (two) times daily as needed for anxiety., Disp: 10 tablet,  Rfl: 0 .  atorvastatin (LIPITOR) 20 MG tablet, TAKE 1 TABLET BY MOUTH ONE TIME DAILY., Disp: 90 tablet, Rfl: 1 .  EPIPEN 2-PAK 0.3 MG/0.3ML SOAJ injection, INJECT INTRAMUSCULARLY AS NEEDED FOR ALLERGIC REACTION AS DIRECTED BY PRESCRIBER, Disp: 4 Device, Rfl: 1 .  gabapentin (NEURONTIN) 100 MG capsule, Take 2 capsules (200 mg total) by mouth at bedtime., Disp: 60 capsule, Rfl: 3 .  lisinopril (PRINIVIL,ZESTRIL) 20 MG tablet, Take 1 tablet (20 mg total) by mouth daily., Disp: 90 tablet, Rfl: 3 .  meloxicam (MOBIC) 15 MG tablet, Take 15 mg by mouth daily as needed for pain. , Disp: , Rfl:  .  metFORMIN (GLUCOPHAGE-XR) 500 MG 24 hr tablet, TAKE ONE TABLET BY MOUTH TWO TIMES DAILY, Disp: 180 tablet, Rfl: 2 .  Multiple Vitamin (MULTIVITAMIN) tablet, Take 1 tablet by mouth daily., Disp: , Rfl:  .  nebivolol (BYSTOLIC) 2.5 MG tablet, Take 1 tablet (2.5 mg total) by mouth daily., Disp: 90 tablet, Rfl: 3 .  ONE TOUCH LANCETS MISC, Use once daily to check blood sugar.  DX E11.9, Disp: 100 each, Rfl: 6  Objective: BP 120/70 (BP Location: Left Arm, Patient Position: Sitting, Cuff Size: Large)   Pulse 80   Temp 98.4 F (36.9 C) (Oral)   Ht 5' 9"  (1.753 m)   Wt 181 lb 6.4 oz (82.3 kg)   SpO2  98%   BMI 26.79 kg/m  General: Awake, appears stated age HEENT: MMM, EOMi, ears patent, TM's neg b/l Heart: RRR, no murmurs Lungs: CTAB, no rales, wheezes or rhonchi. No accessory muscle use Abd: BS+, soft, NT, ND, no masses or organomegaly MSK: 5/5 strength throughout, mild TTP over the upper arm musculature, no pain elicited with strength testing Neuro: 3/4 patellar reflex b/l, 2/4 biceps reflex b/l, no clonus, no cerebellar signs Psych: Age appropriate judgment and insight, normal affect and mood  Assessment and Plan: Myalgia - Plan: CBC w/Diff, Comprehensive metabolic panel, Vitamin D (25 hydroxy), Sed Rate (ESR), C-reactive protein  Loose stools - Plan: Stool Culture, Ova and parasite examination  Orders  as above. R/o enteric etiology given BM's. Basic labs. Ibuprofen and acetaminophen for pain. Very nonspecific exam.  F/u prn. The patient voiced understanding and agreement to the plan.  [place], DO 11/18/16  1:55 PM

## 2016-11-18 NOTE — Progress Notes (Signed)
Pre visit review using our clinic review tool, if applicable. No additional management support is needed unless otherwise documented below in the visit note. 

## 2016-11-21 LAB — OVA AND PARASITE EXAMINATION: OP: NONE SEEN

## 2016-11-22 ENCOUNTER — Encounter: Payer: Self-pay | Admitting: Family Medicine

## 2016-11-22 LAB — STOOL CULTURE

## 2016-11-23 DIAGNOSIS — J101 Influenza due to other identified influenza virus with other respiratory manifestations: Secondary | ICD-10-CM | POA: Diagnosis not present

## 2016-11-23 DIAGNOSIS — J029 Acute pharyngitis, unspecified: Secondary | ICD-10-CM | POA: Diagnosis not present

## 2016-11-29 ENCOUNTER — Encounter: Payer: Self-pay | Admitting: Family Medicine

## 2016-12-07 ENCOUNTER — Telehealth: Payer: Federal, State, Local not specified - PPO | Admitting: Family

## 2016-12-07 DIAGNOSIS — J209 Acute bronchitis, unspecified: Secondary | ICD-10-CM

## 2016-12-07 MED ORDER — AZITHROMYCIN 250 MG PO TABS: ORAL_TABLET | ORAL | 0 refills | Status: DC

## 2016-12-07 MED ORDER — BENZONATATE 100 MG PO CAPS: 100.0000 mg | ORAL_CAPSULE | Freq: Three times a day (TID) | ORAL | 0 refills | Status: DC | PRN

## 2016-12-07 NOTE — Progress Notes (Signed)

## 2016-12-18 ENCOUNTER — Other Ambulatory Visit: Payer: Self-pay | Admitting: Internal Medicine

## 2016-12-20 NOTE — Telephone Encounter (Signed)
Yes please refill, as indicated in her assessment and plan with this pt at last OV.  Thanks!       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 He increase his exercise and hydration with improvement of his symptoms will decrease Bystolic to 2.5 mg daily.  2. Prior cardiac cath - mild CAD, continue atorvastatin, lisinopril and Bystolic.  3. HTN - BP ok on current regimen, previously LVH and grade 2 diastolic dysfunction, now with controlled blood pressure normal diastolic parameters and no LVH.   4. Lipids - he is diabetic, tolerating atorvastatin without side effects, all lipids were at goal in June 2017..  5. Chronic diastolic CHF - euvolemic  Follow up in 6 months.  Tobias AlexanderKatarina Nelson, MD 08/17/2016 7:11 AM [facility] [address] [place], [place]  [address] Phone: [phone] Fax: [phone]

## 2016-12-20 NOTE — Telephone Encounter (Signed)
This was started by Dr Johney FrameAllred 12/28/15 but the patient is prn follow up with Dr Johney FrameAllred. Patient sees Dr Delton SeeNelson regularly. Okay to fill under Dr Delton SeeNelson? Please advise. Thanks, MI

## 2016-12-27 ENCOUNTER — Ambulatory Visit (INDEPENDENT_AMBULATORY_CARE_PROVIDER_SITE_OTHER): Payer: Federal, State, Local not specified - PPO | Admitting: Family Medicine

## 2016-12-27 ENCOUNTER — Encounter: Payer: Self-pay | Admitting: Family Medicine

## 2016-12-27 VITALS — BP 120/82 | HR 77 | Temp 98.1°F | Ht 69.0 in | Wt 185.0 lb

## 2016-12-27 DIAGNOSIS — M5090 Cervical disc disorder, unspecified, unspecified cervical region: Secondary | ICD-10-CM | POA: Diagnosis not present

## 2016-12-27 DIAGNOSIS — D708 Other neutropenia: Secondary | ICD-10-CM

## 2016-12-27 DIAGNOSIS — R946 Abnormal results of thyroid function studies: Secondary | ICD-10-CM

## 2016-12-27 DIAGNOSIS — R0789 Other chest pain: Secondary | ICD-10-CM

## 2016-12-27 DIAGNOSIS — J329 Chronic sinusitis, unspecified: Secondary | ICD-10-CM

## 2016-12-27 DIAGNOSIS — I10 Essential (primary) hypertension: Secondary | ICD-10-CM | POA: Diagnosis not present

## 2016-12-27 DIAGNOSIS — R0981 Nasal congestion: Secondary | ICD-10-CM

## 2016-12-27 DIAGNOSIS — Z0000 Encounter for general adult medical examination without abnormal findings: Secondary | ICD-10-CM | POA: Diagnosis not present

## 2016-12-27 DIAGNOSIS — R000 Tachycardia, unspecified: Secondary | ICD-10-CM | POA: Diagnosis not present

## 2016-12-27 DIAGNOSIS — M999 Biomechanical lesion, unspecified: Secondary | ICD-10-CM

## 2016-12-27 DIAGNOSIS — E782 Mixed hyperlipidemia: Secondary | ICD-10-CM | POA: Diagnosis not present

## 2016-12-27 DIAGNOSIS — E119 Type 2 diabetes mellitus without complications: Secondary | ICD-10-CM

## 2016-12-27 DIAGNOSIS — J0100 Acute maxillary sinusitis, unspecified: Secondary | ICD-10-CM

## 2016-12-27 DIAGNOSIS — R7989 Other specified abnormal findings of blood chemistry: Secondary | ICD-10-CM

## 2016-12-27 HISTORY — DX: Chronic sinusitis, unspecified: J32.9

## 2016-12-27 LAB — LIPID PANEL
CHOL/HDL RATIO: 3
Cholesterol: 142 mg/dL (ref 0–200)
HDL: 53.9 mg/dL (ref >39.00)
LDL Cholesterol: 71 mg/dL (ref 0–99)
NONHDL: 87.9
TRIGLYCERIDES: 86 mg/dL (ref 0.0–149.0)
VLDL: 17.2 mg/dL (ref 0.0–40.0)

## 2016-12-27 LAB — CBC WITH DIFFERENTIAL/PLATELET
BASOS ABS: 0 10*3/uL (ref 0.0–0.1)
BASOS PCT: 0.5 % (ref 0.0–3.0)
EOS ABS: 0.1 10*3/uL (ref 0.0–0.7)
Eosinophils Relative: 4.3 % (ref 0.0–5.0)
HCT: 46.7 % (ref 39.0–52.0)
Hemoglobin: 15.6 g/dL (ref 13.0–17.0)
LYMPHS ABS: 1.1 10*3/uL (ref 0.7–4.0)
Lymphocytes Relative: 34.7 % (ref 12.0–46.0)
MCHC: 33.4 g/dL (ref 30.0–36.0)
MCV: 91.9 fl (ref 78.0–100.0)
MONO ABS: 0.4 10*3/uL (ref 0.1–1.0)
Monocytes Relative: 12.1 % — ABNORMAL HIGH (ref 3.0–12.0)
NEUTROS ABS: 1.6 10*3/uL (ref 1.4–7.7)
NEUTROS PCT: 48.4 % (ref 43.0–77.0)
PLATELETS: 156 10*3/uL (ref 150.0–400.0)
RBC: 5.08 Mil/uL (ref 4.22–5.81)
RDW: 13.1 % (ref 11.5–15.5)
WBC: 3.2 10*3/uL — ABNORMAL LOW (ref 4.0–10.5)

## 2016-12-27 LAB — COMPREHENSIVE METABOLIC PANEL
ALBUMIN: 4.5 g/dL (ref 3.5–5.2)
ALK PHOS: 52 U/L (ref 39–117)
ALT: 16 U/L (ref 0–53)
AST: 17 U/L (ref 0–37)
BILIRUBIN TOTAL: 0.7 mg/dL (ref 0.2–1.2)
BUN: 16 mg/dL (ref 6–23)
CO2: 26 mEq/L (ref 19–32)
Calcium: 10 mg/dL (ref 8.4–10.5)
Chloride: 102 mEq/L (ref 96–112)
Creatinine, Ser: 0.86 mg/dL (ref 0.40–1.50)
GFR: 124.32 mL/min (ref >60.00)
GLUCOSE: 120 mg/dL — AB (ref 70–99)
POTASSIUM: 3.9 meq/L (ref 3.5–5.1)
SODIUM: 137 meq/L (ref 135–145)
TOTAL PROTEIN: 7.5 g/dL (ref 6.0–8.3)

## 2016-12-27 LAB — HEMOGLOBIN A1C: Hgb A1c MFr Bld: 6.4 % (ref 4.6–6.5)

## 2016-12-27 LAB — SEDIMENTATION RATE: Sed Rate: 8 mm/hr (ref 0–15)

## 2016-12-27 MED ORDER — AMOXICILLIN 500 MG PO CAPS: 500.0000 mg | ORAL_CAPSULE | Freq: Three times a day (TID) | ORAL | 0 refills | Status: DC

## 2016-12-27 MED ORDER — MELOXICAM 15 MG PO TABS: 15.0000 mg | ORAL_TABLET | Freq: Every day | ORAL | 5 refills | Status: DC | PRN

## 2016-12-27 NOTE — Assessment & Plan Note (Signed)
Encouraged moist heat and gentle stretching as tolerated. May try NSAIDs and prescription meds as directed and report if symptoms worsen or seek immediate care 

## 2016-12-27 NOTE — Assessment & Plan Note (Addendum)
hgba1c acceptable, minimize simple carbs. Increase exercise as tolerated. Continue current meds. Highest number he remembers in past month is 119

## 2016-12-27 NOTE — Assessment & Plan Note (Signed)
Tolerating statin, encouraged heart healthy diet, avoid trans fats, minimize simple carbs and saturated fats. Increase exercise as tolerated 

## 2016-12-27 NOTE — Progress Notes (Signed)
Pre visit review using our clinic review tool, if applicable. No additional management support is needed unless otherwise documented below in the visit note. 

## 2016-12-27 NOTE — Assessment & Plan Note (Signed)
Labs run today

## 2016-12-27 NOTE — Assessment & Plan Note (Addendum)
Patient encouraged to maintain heart healthy diet, regular exercise, adequate sleep. Consider daily probiotics. Take medications as prescribed. Labs and cardiac testing.

## 2016-12-27 NOTE — Assessment & Plan Note (Addendum)
RRR today 

## 2016-12-27 NOTE — Assessment & Plan Note (Signed)
Well controlled, no changes to meds. Encouraged heart healthy diet such as the DASH diet and exercise as tolerated.  °

## 2016-12-27 NOTE — Patient Instructions (Addendum)
Aged or black garlic, elderberry, vitamin C 500 - 1000 mg daily, zinc, probiotic daily NOW company at [email or web address]   Preventive Care 18-39 Years, Male Preventive care refers to lifestyle choices and visits with your health care provider that can promote health and wellness. What does preventive care include?  A yearly physical exam. This is also called an annual well check.  Dental exams once or twice a year.  Routine eye exams. Ask your health care provider how often you should have your eyes checked.  Personal lifestyle choices, including:  Daily care of your teeth and gums.  Regular physical activity.  Eating a healthy diet.  Avoiding tobacco and drug use.  Limiting alcohol use.  Practicing safe sex. What happens during an annual well check? The services and screenings done by your health care provider during your annual well check will depend on your age, overall health, lifestyle risk factors, and family history of disease. Counseling  Your health care provider may ask you questions about your:  Alcohol use.  Tobacco use.  Drug use.  Emotional well-being.  Home and relationship well-being.  Sexual activity.  Eating habits.  Work and work Astronomer. Screening  You may have the following tests or measurements:  Height, weight, and BMI.  Blood pressure.  Lipid and cholesterol levels. These may be checked every 5 years starting at age 55.  Diabetes screening. This is done by checking your blood sugar (glucose) after you have not eaten for a while (fasting).  Skin check.  Hepatitis C blood test.  Hepatitis B blood test.  Sexually transmitted disease (STD) testing. Discuss your test results, treatment options, and if necessary, the need for more tests with your health care provider. Vaccines  Your health care provider may recommend certain vaccines, such as:  Influenza vaccine. This is recommended every year.  Tetanus, diphtheria, and  acellular pertussis (Tdap, Td) vaccine. You may need a Td booster every 10 years.  Varicella vaccine. You may need this if you have not been vaccinated.  HPV vaccine. If you are 35 or younger, you may need three doses over 6 months.  Measles, mumps, and rubella (MMR) vaccine. You may need at least one dose of MMR.You may also need a second dose.  Pneumococcal 13-valent conjugate (PCV13) vaccine. You may need this if you have certain conditions and have not been vaccinated.  Pneumococcal polysaccharide (PPSV23) vaccine. You may need one or two doses if you smoke cigarettes or if you have certain conditions.  Meningococcal vaccine. One dose is recommended if you are age 55-21 years and a first-year college student living in a residence hall, or if you have one of several medical conditions. You may also need additional booster doses.  Hepatitis A vaccine. You may need this if you have certain conditions or if you travel or work in places where you may be exposed to hepatitis A.  Hepatitis B vaccine. You may need this if you have certain conditions or if you travel or work in places where you may be exposed to hepatitis B.  Haemophilus influenzae type b (Hib) vaccine. You may need this if you have certain risk factors. Talk to your health care provider about which screenings and vaccines you need and how often you need them. This information is not intended to replace advice given to you by your health care provider. Make sure you discuss any questions you have with your health care provider. Document Released: 11/22/2001 Document Revised: 06/15/2016 Document Reviewed: 07/28/2015 Elsevier Interactive  Patient Education  2017 [employer].

## 2016-12-27 NOTE — Assessment & Plan Note (Signed)
Slowly improving but is encouraged to add garlic, Elderberry, probiotics and zinc as well as vitamin C. If symptoms worsen he has a prescription for amoxicillin he may take for 10 days and he will return for further evaluation.

## 2016-12-27 NOTE — Assessment & Plan Note (Signed)
Mild stable, will continue to monitor

## 2016-12-27 NOTE — Assessment & Plan Note (Signed)
Has flared with recent head cold, not taking any meds. Encouraged moist heat and gentle stretching as tolerated. May try NSAIDs and prescription meds as directed and report if symptoms worsen or seek immediate care. Could consider returning to Dr Katrinka BlazingSmith again.
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
## 2016-12-27 NOTE — Assessment & Plan Note (Signed)
Flare with recent respiratory illness but not severe and is similar to previous episodes. If his respiratory symptoms fully resolved and his discomfort is persistent he will notify the office. Previous cardiac workup unremarkable.

## 2017-04-20 ENCOUNTER — Other Ambulatory Visit (INDEPENDENT_AMBULATORY_CARE_PROVIDER_SITE_OTHER): Payer: Federal, State, Local not specified - PPO

## 2017-04-20 ENCOUNTER — Other Ambulatory Visit: Payer: Self-pay

## 2017-04-20 DIAGNOSIS — E119 Type 2 diabetes mellitus without complications: Secondary | ICD-10-CM

## 2017-04-20 DIAGNOSIS — E782 Mixed hyperlipidemia: Secondary | ICD-10-CM

## 2017-04-20 DIAGNOSIS — I10 Essential (primary) hypertension: Secondary | ICD-10-CM

## 2017-04-20 DIAGNOSIS — Z0000 Encounter for general adult medical examination without abnormal findings: Secondary | ICD-10-CM

## 2017-04-21 LAB — COMPREHENSIVE METABOLIC PANEL
ALT: 26 U/L (ref 0–53)
AST: 19 U/L (ref 0–37)
Albumin: 4.5 g/dL (ref 3.5–5.2)
Alkaline Phosphatase: 46 U/L (ref 39–117)
BILIRUBIN TOTAL: 0.9 mg/dL (ref 0.2–1.2)
BUN: 13 mg/dL (ref 6–23)
CHLORIDE: 101 meq/L (ref 96–112)
CO2: 26 meq/L (ref 19–32)
Calcium: 9.9 mg/dL (ref 8.4–10.5)
Creatinine, Ser: 0.93 mg/dL (ref 0.40–1.50)
GFR: 113.42 mL/min (ref >60.00)
GLUCOSE: 134 mg/dL — AB (ref 70–99)
Potassium: 3.6 mEq/L (ref 3.5–5.1)
Sodium: 137 mEq/L (ref 135–145)
Total Protein: 7.4 g/dL (ref 6.0–8.3)

## 2017-04-21 LAB — CBC
HCT: 45.7 % (ref 39.0–52.0)
HEMOGLOBIN: 15.6 g/dL (ref 13.0–17.0)
MCHC: 34.1 g/dL (ref 30.0–36.0)
MCV: 91.5 fl (ref 78.0–100.0)
PLATELETS: 188 10*3/uL (ref 150.0–400.0)
RBC: 4.99 Mil/uL (ref 4.22–5.81)
RDW: 12.7 % (ref 11.5–15.5)
WBC: 3.7 10*3/uL — ABNORMAL LOW (ref 4.0–10.5)

## 2017-04-21 LAB — LIPID PANEL
CHOL/HDL RATIO: 3
Cholesterol: 127 mg/dL (ref 0–200)
HDL: 45.1 mg/dL (ref >39.00)
LDL CALC: 61 mg/dL (ref 0–99)
NONHDL: 81.55
Triglycerides: 102 mg/dL (ref 0.0–149.0)
VLDL: 20.4 mg/dL (ref 0.0–40.0)

## 2017-04-21 LAB — TSH: TSH: 0.6 u[IU]/mL (ref 0.35–4.50)

## 2017-04-21 LAB — HEMOGLOBIN A1C: Hgb A1c MFr Bld: 6.3 % (ref 4.6–6.5)

## 2017-04-27 ENCOUNTER — Ambulatory Visit: Payer: Self-pay | Admitting: Family Medicine

## 2017-05-08 ENCOUNTER — Ambulatory Visit (INDEPENDENT_AMBULATORY_CARE_PROVIDER_SITE_OTHER): Payer: Federal, State, Local not specified - PPO | Admitting: Family Medicine

## 2017-05-08 ENCOUNTER — Encounter: Payer: Self-pay | Admitting: Family Medicine

## 2017-05-08 DIAGNOSIS — I10 Essential (primary) hypertension: Secondary | ICD-10-CM | POA: Diagnosis not present

## 2017-05-08 DIAGNOSIS — E119 Type 2 diabetes mellitus without complications: Secondary | ICD-10-CM

## 2017-05-08 DIAGNOSIS — J0100 Acute maxillary sinusitis, unspecified: Secondary | ICD-10-CM | POA: Diagnosis not present

## 2017-05-08 DIAGNOSIS — E782 Mixed hyperlipidemia: Secondary | ICD-10-CM | POA: Diagnosis not present

## 2017-05-08 NOTE — Assessment & Plan Note (Signed)
Well controlled, no changes to meds. Encouraged heart healthy diet such as the DASH diet and exercise as tolerated.  °
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
## 2017-05-08 NOTE — Patient Instructions (Signed)

## 2017-05-08 NOTE — Assessment & Plan Note (Signed)
hgba1c acceptable, minimize simple carbs. Increase exercise as tolerated. Continue current meds 

## 2017-05-08 NOTE — Assessment & Plan Note (Signed)
Tolerating statin, encouraged heart healthy diet, avoid trans fats, minimize simple carbs and saturated fats. Increase exercise as tolerated 

## 2017-05-08 NOTE — Assessment & Plan Note (Signed)
Just getting over a URI, no acute complaints.

## 2017-05-09 ENCOUNTER — Other Ambulatory Visit: Payer: Self-pay | Admitting: Family Medicine

## 2017-06-27 ENCOUNTER — Other Ambulatory Visit: Payer: Self-pay | Admitting: Family Medicine

## 2017-07-25 ENCOUNTER — Telehealth: Payer: Self-pay | Admitting: Family Medicine

## 2017-07-25 DIAGNOSIS — Z79899 Other long term (current) drug therapy: Secondary | ICD-10-CM

## 2017-07-27 MED ORDER — ALPRAZOLAM 0.25 MG PO TABS: 0.2500 mg | ORAL_TABLET | Freq: Two times a day (BID) | ORAL | 0 refills | Status: DC | PRN

## 2017-07-27 NOTE — Telephone Encounter (Signed)
Pt is requesting refill on alprazolam 0.25mg .  Last OV: 05/08/2017 Last Fill: 06/24/2016 #10 and 0RF UDS: None seen Contract None seen   Please advise.

## 2017-07-27 NOTE — Telephone Encounter (Signed)
OK to refill but let him know we need to complete a drug screen and a contract

## 2017-07-31 ENCOUNTER — Telehealth: Payer: Self-pay | Admitting: *Deleted

## 2017-07-31 NOTE — Telephone Encounter (Signed)
Received Controlled Substance Contract completed by patient; forwarded to provider/SLS 10/22

## 2017-07-31 NOTE — Telephone Encounter (Signed)
Left message for patient to call the office back

## 2017-08-01 ENCOUNTER — Telehealth: Payer: Self-pay | Admitting: Family Medicine

## 2017-08-01 ENCOUNTER — Encounter: Payer: Self-pay | Admitting: Family Medicine

## 2017-08-03 ENCOUNTER — Other Ambulatory Visit: Payer: Federal, State, Local not specified - PPO

## 2017-08-03 DIAGNOSIS — Z79899 Other long term (current) drug therapy: Secondary | ICD-10-CM
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
## 2017-08-03 MED FILL — ALPRAZolam 0.25 MG TABS: 0.25 | 5 days supply | Qty: 10 | Fill #0

## 2017-08-04 LAB — PAIN MGMT, PROFILE 8 W/CONF, U
6 Acetylmorphine: NEGATIVE ng/mL (ref <10)
Alcohol Metabolites: NEGATIVE ng/mL (ref <500)
Amphetamines: NEGATIVE ng/mL (ref <500)
BUPRENORPHINE, URINE: NEGATIVE ng/mL (ref <5)
Benzodiazepines: NEGATIVE ng/mL (ref <100)
CREATININE: 148.1 mg/dL
Cocaine Metabolite: NEGATIVE ng/mL (ref <150)
MARIJUANA METABOLITE: NEGATIVE ng/mL (ref <20)
MDMA: NEGATIVE ng/mL (ref <500)
OPIATES: NEGATIVE ng/mL (ref <100)
Oxidant: NEGATIVE ug/mL (ref <200)
Oxycodone: NEGATIVE ng/mL (ref <100)
pH: 5.9 (ref 4.5–9.0)

## 2017-08-04 NOTE — Telephone Encounter (Signed)
Pt is requesting refill on alprazolam 0.25mg .  Last OV: 05/08/2017 Last Fill: 07/27/2017 #10 and 0RF UDS: Pending (Collected yesterday)   Please advise.

## 2017-08-05 ENCOUNTER — Other Ambulatory Visit: Payer: Self-pay | Admitting: Family Medicine

## 2017-08-05 MED ORDER — ALPRAZOLAM 0.25 MG PO TABS: 0.2500 mg | ORAL_TABLET | Freq: Two times a day (BID) | ORAL | 0 refills | Status: DC | PRN

## 2017-08-05 NOTE — Telephone Encounter (Signed)
Of course please refill he has to travel soon. I have printed

## 2017-08-06 ENCOUNTER — Other Ambulatory Visit: Payer: Self-pay | Admitting: Family Medicine

## 2017-08-07 NOTE — Telephone Encounter (Signed)
Patient already has rx

## 2017-08-30 ENCOUNTER — Other Ambulatory Visit: Payer: Self-pay | Admitting: Family Medicine

## 2017-08-30 MED ORDER — ATORVASTATIN CALCIUM 20 MG PO TABS: 20.0000 mg | ORAL_TABLET | ORAL | 0 refills | Status: DC

## 2017-09-03 ENCOUNTER — Other Ambulatory Visit: Payer: Self-pay | Admitting: Cardiology

## 2017-09-03 DIAGNOSIS — I10 Essential (primary) hypertension: Secondary | ICD-10-CM

## 2017-09-03 DIAGNOSIS — R000 Tachycardia, unspecified: Secondary | ICD-10-CM

## 2017-09-03 DIAGNOSIS — I2510 Atherosclerotic heart disease of native coronary artery without angina pectoris: Secondary | ICD-10-CM

## 2017-09-03 DIAGNOSIS — E7849 Other hyperlipidemia: Secondary | ICD-10-CM

## 2017-09-24 ENCOUNTER — Encounter: Payer: Self-pay | Admitting: Family Medicine

## 2017-09-25 ENCOUNTER — Other Ambulatory Visit: Payer: Self-pay | Admitting: Cardiology

## 2017-09-25 ENCOUNTER — Other Ambulatory Visit: Payer: Self-pay | Admitting: Family Medicine

## 2017-09-25 DIAGNOSIS — J358 Other chronic diseases of tonsils and adenoids: Secondary | ICD-10-CM

## 2017-10-02 ENCOUNTER — Other Ambulatory Visit: Payer: Self-pay | Admitting: Cardiology

## 2017-10-02 DIAGNOSIS — I2510 Atherosclerotic heart disease of native coronary artery without angina pectoris: Secondary | ICD-10-CM

## 2017-10-02 DIAGNOSIS — I10 Essential (primary) hypertension: Secondary | ICD-10-CM

## 2017-10-02 DIAGNOSIS — E7849 Other hyperlipidemia: Secondary | ICD-10-CM

## 2017-10-02 DIAGNOSIS — R000 Tachycardia, unspecified: Secondary | ICD-10-CM

## 2017-10-09 DIAGNOSIS — J358 Other chronic diseases of tonsils and adenoids: Secondary | ICD-10-CM | POA: Diagnosis not present

## 2017-10-20 ENCOUNTER — Other Ambulatory Visit: Payer: Self-pay | Admitting: Cardiology

## 2017-10-30 ENCOUNTER — Other Ambulatory Visit (INDEPENDENT_AMBULATORY_CARE_PROVIDER_SITE_OTHER): Payer: Federal, State, Local not specified - PPO

## 2017-10-30 DIAGNOSIS — E782 Mixed hyperlipidemia: Secondary | ICD-10-CM

## 2017-10-30 DIAGNOSIS — E119 Type 2 diabetes mellitus without complications: Secondary | ICD-10-CM | POA: Diagnosis not present

## 2017-10-30 DIAGNOSIS — I10 Essential (primary) hypertension: Secondary | ICD-10-CM | POA: Diagnosis not present

## 2017-10-31 LAB — LIPID PANEL
CHOL/HDL RATIO: 3
Cholesterol: 141 mg/dL (ref 0–200)
HDL: 44 mg/dL (ref >39.00)
LDL Cholesterol: 81 mg/dL (ref 0–99)
NonHDL: 96.91
Triglycerides: 80 mg/dL (ref 0.0–149.0)
VLDL: 16 mg/dL (ref 0.0–40.0)

## 2017-10-31 LAB — CBC
HEMATOCRIT: 45.5 % (ref 39.0–52.0)
HEMOGLOBIN: 15.4 g/dL (ref 13.0–17.0)
MCHC: 33.8 g/dL (ref 30.0–36.0)
MCV: 92.6 fl (ref 78.0–100.0)
PLATELETS: 180 10*3/uL (ref 150.0–400.0)
RBC: 4.92 Mil/uL (ref 4.22–5.81)
RDW: 13.1 % (ref 11.5–15.5)
WBC: 3.8 10*3/uL — ABNORMAL LOW (ref 4.0–10.5)

## 2017-10-31 LAB — COMPREHENSIVE METABOLIC PANEL
ALT: 29 U/L (ref 0–53)
AST: 23 U/L (ref 0–37)
Albumin: 4.3 g/dL (ref 3.5–5.2)
Alkaline Phosphatase: 45 U/L (ref 39–117)
BUN: 13 mg/dL (ref 6–23)
CO2: 28 meq/L (ref 19–32)
Calcium: 9.1 mg/dL (ref 8.4–10.5)
Chloride: 102 mEq/L (ref 96–112)
Creatinine, Ser: 0.89 mg/dL (ref 0.40–1.50)
GFR: 119.03 mL/min (ref >60.00)
Glucose, Bld: 106 mg/dL — ABNORMAL HIGH (ref 70–99)
POTASSIUM: 3.7 meq/L (ref 3.5–5.1)
SODIUM: 138 meq/L (ref 135–145)
Total Bilirubin: 0.8 mg/dL (ref 0.2–1.2)
Total Protein: 7.3 g/dL (ref 6.0–8.3)

## 2017-10-31 LAB — TSH: TSH: 0.84 u[IU]/mL (ref 0.35–4.50)

## 2017-10-31 LAB — HEMOGLOBIN A1C: Hgb A1c MFr Bld: 6.4 % (ref 4.6–6.5)

## 2017-11-06 ENCOUNTER — Ambulatory Visit: Payer: Self-pay | Admitting: Family Medicine

## 2017-11-15 ENCOUNTER — Other Ambulatory Visit: Payer: Self-pay | Admitting: Cardiology

## 2017-11-21 ENCOUNTER — Ambulatory Visit: Payer: Federal, State, Local not specified - PPO | Admitting: Family Medicine

## 2017-11-21 ENCOUNTER — Encounter: Payer: Self-pay | Admitting: Family Medicine

## 2017-11-21 DIAGNOSIS — I2510 Atherosclerotic heart disease of native coronary artery without angina pectoris: Secondary | ICD-10-CM | POA: Diagnosis not present

## 2017-11-21 DIAGNOSIS — E7849 Other hyperlipidemia: Secondary | ICD-10-CM | POA: Diagnosis not present

## 2017-11-21 DIAGNOSIS — R000 Tachycardia, unspecified: Secondary | ICD-10-CM

## 2017-11-21 DIAGNOSIS — I10 Essential (primary) hypertension: Secondary | ICD-10-CM

## 2017-11-21 DIAGNOSIS — E119 Type 2 diabetes mellitus without complications: Secondary | ICD-10-CM | POA: Diagnosis not present

## 2017-11-21 DIAGNOSIS — E782 Mixed hyperlipidemia: Secondary | ICD-10-CM | POA: Diagnosis not present

## 2017-11-21 LAB — HM DIABETES EYE EXAM

## 2017-11-21 MED ORDER — LISINOPRIL 20 MG PO TABS: 20.0000 mg | ORAL_TABLET | Freq: Every day | ORAL | 1 refills | Status: DC

## 2017-11-21 MED ORDER — NEBIVOLOL HCL 2.5 MG PO TABS: 2.5000 mg | ORAL_TABLET | Freq: Every day | ORAL | 0 refills | Status: DC | PRN

## 2017-11-21 NOTE — Patient Instructions (Addendum)
Curcumen caps daily especially when traveling  Drop Bystolic to every other day and monitor bp and pulse if no spikes then stop and use as needed Want pulse 60-90 and BP 100-140/60-90 Hypertension Hypertension is another name for high blood pressure. High blood pressure forces your heart to work harder to pump blood. This can cause problems over time. There are two numbers in a blood pressure reading. There is a top number (systolic) over a bottom number (diastolic). It is best to have a blood pressure below 120/80. Healthy choices can help lower your blood pressure. You may need medicine to help lower your blood pressure if:  Your blood pressure cannot be lowered with healthy choices.  Your blood pressure is higher than 130/80.  Follow these instructions at home: Eating and drinking  If directed, follow the DASH eating plan. This diet includes: ? Filling half of your plate at each meal with fruits and vegetables. ? Filling one quarter of your plate at each meal with whole grains. Whole grains include whole wheat pasta, brown rice, and whole grain bread. ? Eating or drinking low-fat dairy products, such as skim milk or low-fat yogurt. ? Filling one quarter of your plate at each meal with low-fat (lean) proteins. Low-fat proteins include fish, skinless chicken, eggs, beans, and tofu. ? Avoiding fatty meat, cured and processed meat, or chicken with skin. ? Avoiding premade or processed food.  Eat less than 1,500 mg of salt (sodium) a day.  Limit alcohol use to no more than 1 drink a day for nonpregnant women and 2 drinks a day for men. One drink equals 12 oz of beer, 5 oz of wine, or 1 oz of hard liquor. Lifestyle  Work with your doctor to stay at a healthy weight or to lose weight. Ask your doctor what the best weight is for you.  Get at least 30 minutes of exercise that causes your heart to beat faster (aerobic exercise) most days of the week. This may include walking, swimming, or  biking.  Get at least 30 minutes of exercise that strengthens your muscles (resistance exercise) at least 3 days a week. This may include lifting weights or pilates.  Do not use any products that contain nicotine or tobacco. This includes cigarettes and e-cigarettes. If you need help quitting, ask your doctor.  Check your blood pressure at home as told by your doctor.  Keep all follow-up visits as told by your doctor. This is important. Medicines  Take over-the-counter and prescription medicines only as told by your doctor. Follow directions carefully.  Do not skip doses of blood pressure medicine. The medicine does not work as well if you skip doses. Skipping doses also puts you at risk for problems.  Ask your doctor about side effects or reactions to medicines that you should watch for. Contact a doctor if:  You think you are having a reaction to the medicine you are taking.  You have headaches that keep coming back (recurring).  You feel dizzy.  You have swelling in your ankles.  You have trouble with your vision. Get help right away if:  You get a very bad headache.  You start to feel confused.  You feel weak or numb.  You feel faint.  You get very bad pain in your: ? Chest. ? Belly (abdomen).  You throw up (vomit) more than once.  You have trouble breathing. Summary  Hypertension is another name for high blood pressure.  Making healthy choices can help lower  blood pressure. If your blood pressure cannot be controlled with healthy choices, you may need to take medicine. This information is not intended to replace advice given to you by your health care provider. Make sure you discuss any questions you have with your health care provider. Document Released: 03/14/2008 Document Revised: 08/24/2016 Document Reviewed: 08/24/2016 Elsevier Interactive Patient Education  [employer].

## 2017-11-21 NOTE — Assessment & Plan Note (Signed)
Encouraged heart healthy diet, increase exercise, avoid trans fats, consider a krill oil cap daily 

## 2017-11-21 NOTE — Assessment & Plan Note (Signed)
Well controlled, no changes to meds. Encouraged heart healthy diet such as the DASH diet and exercise as tolerated.  °

## 2017-11-21 NOTE — Assessment & Plan Note (Signed)
hgba1c acceptable, minimize simple carbs. Increase exercise as tolerated. Continue current meds 
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
## 2017-12-19 ENCOUNTER — Ambulatory Visit: Payer: Federal, State, Local not specified - PPO

## 2018-01-03 ENCOUNTER — Other Ambulatory Visit: Payer: Self-pay | Admitting: Family Medicine

## 2018-01-22 ENCOUNTER — Other Ambulatory Visit: Payer: Self-pay | Admitting: Family Medicine

## 2018-01-22 ENCOUNTER — Encounter: Payer: Self-pay | Admitting: Family Medicine

## 2018-01-22 MED ORDER — CIPROFLOXACIN HCL 500 MG PO TABS: 500.0000 mg | ORAL_TABLET | Freq: Two times a day (BID) | ORAL | 0 refills | Status: DC

## 2018-02-02 ENCOUNTER — Encounter: Payer: Self-pay | Admitting: Family Medicine

## 2018-02-08 ENCOUNTER — Other Ambulatory Visit: Payer: Self-pay | Admitting: Family Medicine

## 2018-02-08 DIAGNOSIS — I2510 Atherosclerotic heart disease of native coronary artery without angina pectoris: Secondary | ICD-10-CM

## 2018-02-08 DIAGNOSIS — E7849 Other hyperlipidemia: Secondary | ICD-10-CM

## 2018-02-08 DIAGNOSIS — R000 Tachycardia, unspecified: Secondary | ICD-10-CM

## 2018-02-08 DIAGNOSIS — I10 Essential (primary) hypertension: Secondary | ICD-10-CM

## 2018-02-09 ENCOUNTER — Encounter: Payer: Self-pay | Admitting: Family Medicine

## 2018-02-09 MED ORDER — NEBIVOLOL HCL 2.5 MG PO TABS: 2.5000 mg | ORAL_TABLET | Freq: Every day | ORAL | 0 refills | Status: DC | PRN

## 2018-02-26 ENCOUNTER — Other Ambulatory Visit: Payer: Self-pay | Admitting: Family Medicine

## 2018-04-06 ENCOUNTER — Encounter: Payer: Self-pay | Admitting: Family Medicine

## 2018-04-06 ENCOUNTER — Encounter: Payer: Self-pay | Admitting: Gastroenterology

## 2018-04-06 ENCOUNTER — Ambulatory Visit: Payer: Federal, State, Local not specified - PPO | Admitting: Family Medicine

## 2018-04-06 VITALS — BP 120/82 | HR 72 | Temp 98.1°F | Resp 18 | Ht 68.5 in | Wt 188.6 lb

## 2018-04-06 DIAGNOSIS — E119 Type 2 diabetes mellitus without complications: Secondary | ICD-10-CM

## 2018-04-06 DIAGNOSIS — K625 Hemorrhage of anus and rectum: Secondary | ICD-10-CM | POA: Diagnosis not present

## 2018-04-06 DIAGNOSIS — E782 Mixed hyperlipidemia: Secondary | ICD-10-CM

## 2018-04-06 DIAGNOSIS — Z0000 Encounter for general adult medical examination without abnormal findings: Secondary | ICD-10-CM | POA: Diagnosis not present

## 2018-04-06 DIAGNOSIS — R000 Tachycardia, unspecified: Secondary | ICD-10-CM | POA: Diagnosis not present

## 2018-04-06 DIAGNOSIS — I10 Essential (primary) hypertension: Secondary | ICD-10-CM

## 2018-04-06 LAB — COMPREHENSIVE METABOLIC PANEL
ALBUMIN: 4.5 g/dL (ref 3.5–5.2)
ALK PHOS: 53 U/L (ref 39–117)
ALT: 17 U/L (ref 0–53)
AST: 16 U/L (ref 0–37)
BILIRUBIN TOTAL: 0.8 mg/dL (ref 0.2–1.2)
BUN: 17 mg/dL (ref 6–23)
CALCIUM: 9.7 mg/dL (ref 8.4–10.5)
CO2: 26 mEq/L (ref 19–32)
Chloride: 104 mEq/L (ref 96–112)
Creatinine, Ser: 0.83 mg/dL (ref 0.40–1.50)
GFR: 128.76 mL/min (ref >60.00)
GLUCOSE: 119 mg/dL — AB (ref 70–99)
Potassium: 4 mEq/L (ref 3.5–5.1)
Sodium: 138 mEq/L (ref 135–145)
TOTAL PROTEIN: 7.1 g/dL (ref 6.0–8.3)

## 2018-04-06 LAB — LIPID PANEL
CHOLESTEROL: 147 mg/dL (ref 0–200)
HDL: 45.4 mg/dL (ref >39.00)
LDL Cholesterol: 90 mg/dL (ref 0–99)
NONHDL: 102
Total CHOL/HDL Ratio: 3
Triglycerides: 62 mg/dL (ref 0.0–149.0)
VLDL: 12.4 mg/dL (ref 0.0–40.0)

## 2018-04-06 LAB — CBC
HCT: 45.9 % (ref 39.0–52.0)
HEMOGLOBIN: 15.6 g/dL (ref 13.0–17.0)
MCHC: 34 g/dL (ref 30.0–36.0)
MCV: 92 fl (ref 78.0–100.0)
PLATELETS: 181 10*3/uL (ref 150.0–400.0)
RBC: 4.99 Mil/uL (ref 4.22–5.81)
RDW: 12.8 % (ref 11.5–15.5)
WBC: 2.9 10*3/uL — AB (ref 4.0–10.5)

## 2018-04-06 LAB — TSH: TSH: 0.52 u[IU]/mL (ref 0.35–4.50)

## 2018-04-06 LAB — PSA: PSA: 1.34 ng/mL (ref 0.10–4.00)

## 2018-04-06 LAB — HEMOGLOBIN A1C: HEMOGLOBIN A1C: 6.5 % (ref 4.6–6.5)

## 2018-04-06 MED ORDER — EPINEPHRINE 0.3 MG/0.3ML IJ SOAJ: INTRAMUSCULAR | 1 refills | Status: AC

## 2018-04-06 MED ORDER — HYDROCORTISONE ACETATE 25 MG RE SUPP: 25.0000 mg | Freq: Every evening | RECTAL | 1 refills | Status: DC | PRN

## 2018-04-06 NOTE — Assessment & Plan Note (Signed)
Patient encouraged to maintain heart healthy diet, regular exercise, adequate sleep. Consider daily probiotics. Take medications as prescribed. Given and reviewed copy of ACP documents from [place] Secretary of State and encouraged to complete and return 

## 2018-04-06 NOTE — Assessment & Plan Note (Signed)
RRR today 

## 2018-04-06 NOTE — Patient Instructions (Signed)
Benefiber or Metamucil/Psyllium powder daily, 64 oz of clear fluids. Exercise and listen to bowels  Preventive Care 45-39 Years, Male Preventive care refers to lifestyle choices and visits with your health care provider that can promote health and wellness. What does preventive care include?  A yearly physical exam. This is also called an annual well check.  Dental exams once or twice a year.  Routine eye exams. Ask your health care provider how often you should have your eyes checked.  Personal lifestyle choices, including: ? Daily care of your teeth and gums. ? Regular physical activity. ? Eating a healthy diet. ? Avoiding tobacco and drug use. ? Limiting alcohol use. ? Practicing safe sex. What happens during an annual well check? The services and screenings done by your health care provider during your annual well check will depend on your age, overall health, lifestyle risk factors, and family history of disease. Counseling Your health care provider may ask you questions about your:  Alcohol use.  Tobacco use.  Drug use.  Emotional well-being.  Home and relationship well-being.  Sexual activity.  Eating habits.  Work and work Statistician.  Screening You may have the following tests or measurements:  Height, weight, and BMI.  Blood pressure.  Lipid and cholesterol levels. These may be checked every 5 years starting at age 45.  Diabetes screening. This is done by checking your blood sugar (glucose) after you have not eaten for a while (fasting).  Skin check.  Hepatitis C blood test.  Hepatitis B blood test.  Sexually transmitted disease (STD) testing.  Discuss your test results, treatment options, and if necessary, the need for more tests with your health care provider. Vaccines Your health care provider may recommend certain vaccines, such as:  Influenza vaccine. This is recommended every year.  Tetanus, diphtheria, and acellular pertussis (Tdap,  Td) vaccine. You may need a Td booster every 10 years.  Varicella vaccine. You may need this if you have not been vaccinated.  HPV vaccine. If you are 43 or younger, you may need three doses over 6 months.  Measles, mumps, and rubella (MMR) vaccine. You may need at least one dose of MMR.You may also need a second dose.  Pneumococcal 13-valent conjugate (PCV13) vaccine. You may need this if you have certain conditions and have not been vaccinated.  Pneumococcal polysaccharide (PPSV23) vaccine. You may need one or two doses if you smoke cigarettes or if you have certain conditions.  Meningococcal vaccine. One dose is recommended if you are age 45-21 years and a first-year college student living in a residence hall, or if you have one of several medical conditions. You may also need additional booster doses.  Hepatitis A vaccine. You may need this if you have certain conditions or if you travel or work in places where you may be exposed to hepatitis A.  Hepatitis B vaccine. You may need this if you have certain conditions or if you travel or work in places where you may be exposed to hepatitis B.  Haemophilus influenzae type b (Hib) vaccine. You may need this if you have certain risk factors.  Talk to your health care provider about which screenings and vaccines you need and how often you need them. This information is not intended to replace advice given to you by your health care provider. Make sure you discuss any questions you have with your health care provider. Document Released: 11/22/2001 Document Revised: 06/15/2016 Document Reviewed: 07/28/2015 Elsevier Interactive Patient Education  2018 Elsevier  Inc[employer]

## 2018-04-06 NOTE — Assessment & Plan Note (Signed)
Encouraged heart healthy diet, increase exercise, avoid trans fats, consider a krill oil cap daily. Tolerating Atorvastatin 

## 2018-04-06 NOTE — Assessment & Plan Note (Signed)
Well controlled, no changes to meds. Encouraged heart healthy diet such as the DASH diet and exercise as tolerated.  °

## 2018-04-06 NOTE — Assessment & Plan Note (Signed)
hgba1c acceptable, minimize simple carbs. Increase exercise as tolerated.  

## 2018-04-07 ENCOUNTER — Encounter: Payer: Self-pay | Admitting: Family Medicine

## 2018-04-12 DIAGNOSIS — K625 Hemorrhage of anus and rectum: Secondary | ICD-10-CM | POA: Insufficient documentation

## 2018-04-12 NOTE — Assessment & Plan Note (Signed)
Has intermittent blood on the tissue especially after straining. Suspicious for hemorrhoids, is referred to gastroenterology for further consideration. Given Anusol HC suppositories to use prn.
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
## 2018-05-08 ENCOUNTER — Other Ambulatory Visit: Payer: Self-pay | Admitting: Gastroenterology

## 2018-05-08 ENCOUNTER — Ambulatory Visit: Payer: Federal, State, Local not specified - PPO | Admitting: Gastroenterology

## 2018-05-08 ENCOUNTER — Encounter: Payer: Self-pay | Admitting: Gastroenterology

## 2018-05-08 VITALS — BP 124/80 | HR 76 | Ht 67.75 in | Wt 192.4 lb

## 2018-05-08 DIAGNOSIS — Z1211 Encounter for screening for malignant neoplasm of colon: Secondary | ICD-10-CM

## 2018-05-08 DIAGNOSIS — K625 Hemorrhage of anus and rectum: Secondary | ICD-10-CM

## 2018-05-08 DIAGNOSIS — K6289 Other specified diseases of anus and rectum: Secondary | ICD-10-CM | POA: Diagnosis not present

## 2018-05-08 DIAGNOSIS — K602 Anal fissure, unspecified: Secondary | ICD-10-CM

## 2018-05-08 DIAGNOSIS — K5900 Constipation, unspecified: Secondary | ICD-10-CM | POA: Diagnosis not present

## 2018-05-08 MED ORDER — PLENVU 140 G PO SOLR: 1.0000 | Freq: Once | ORAL | 0 refills | Status: AC

## 2018-05-08 MED ORDER — AMBULATORY NON FORMULARY MEDICATION: 1 refills | Status: DC

## 2018-05-08 NOTE — Patient Instructions (Signed)
You have been scheduled for a colonoscopy. Please follow written instructions given to you at your visit today.  Please pick up your prep supplies at the pharmacy within the next 1-3 days. If you use inhalers (even only as needed), please bring them with you on the day of your procedure. Your physician has requested that you go to www.startemmi.com and enter the access code given to you at your visit today. This web site gives a general overview about your procedure. However, you should still follow specific instructions given to you by our office regarding your preparation for the procedure.  We have sent given you a printed prescription of nitroglycerin ointment to [facility]  Continue metamucil   Constipation, Adult Constipation is when a person:  Poops (has a bowel movement) fewer times in a week than normal.  Has a hard time pooping.  Has poop that is dry, hard, or bigger than normal.  Follow these instructions at home: Eating and drinking   Eat foods that have a lot of fiber, such as: ? Fresh fruits and vegetables. ? Whole grains. ? Beans.  Eat less of foods that are high in fat, low in fiber, or overly processed, such as: ? Jamaica[place] fries. ? Hamburgers. ? Cookies. ? Candy. ? Soda.  Drink enough fluid to keep your pee (urine) clear or pale yellow. General instructions  Exercise regularly or as told by your doctor.  Go to the restroom when you feel like you need to poop. Do not hold it in.  Take over-the-counter and prescription medicines only as told by your doctor. These include any fiber supplements.  Do pelvic floor retraining exercises, such as: ? Doing deep breathing while relaxing your lower belly (abdomen). ? Relaxing your pelvic floor while pooping.  Watch your condition for any changes.  Keep all follow-up visits as told by your doctor. This is important. Contact a doctor if:  You have pain that gets worse.  You have a fever.  You have not  pooped for 4 days.  You throw up (vomit).  You are not hungry.  You lose weight.  You are bleeding from the anus.  You have thin, pencil-like poop (stool). Get help right away if:  You have a fever, and your symptoms suddenly get worse.  You leak poop or have blood in your poop.  Your belly feels hard or bigger than normal (is bloated).  You have very bad belly pain.  You feel dizzy or you faint. This information is not intended to replace advice given to you by your health care provider. Make sure you discuss any questions you have with your health care provider. Document Released: 03/14/2008 Document Revised: 04/15/2016 Document Reviewed: 03/16/2016 Elsevier Interactive Patient Education  2018 [employer]
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
## 2018-05-09 ENCOUNTER — Encounter: Payer: Self-pay | Admitting: Gastroenterology

## 2018-05-10 ENCOUNTER — Other Ambulatory Visit: Payer: Self-pay | Admitting: Family Medicine

## 2018-05-10 DIAGNOSIS — I10 Essential (primary) hypertension: Secondary | ICD-10-CM

## 2018-05-10 DIAGNOSIS — I2510 Atherosclerotic heart disease of native coronary artery without angina pectoris: Secondary | ICD-10-CM

## 2018-05-10 DIAGNOSIS — R000 Tachycardia, unspecified: Secondary | ICD-10-CM

## 2018-05-10 DIAGNOSIS — E7849 Other hyperlipidemia: Secondary | ICD-10-CM

## 2018-06-07 ENCOUNTER — Other Ambulatory Visit: Payer: Self-pay | Admitting: Family Medicine

## 2018-06-26 ENCOUNTER — Other Ambulatory Visit: Payer: Self-pay | Admitting: Family Medicine

## 2018-07-03 ENCOUNTER — Encounter: Payer: Self-pay | Admitting: Gastroenterology

## 2018-07-18 ENCOUNTER — Encounter: Payer: Self-pay | Admitting: Gastroenterology

## 2018-09-27 ENCOUNTER — Other Ambulatory Visit: Payer: Self-pay | Admitting: Family Medicine

## 2018-09-27 DIAGNOSIS — E7849 Other hyperlipidemia: Secondary | ICD-10-CM

## 2018-09-27 DIAGNOSIS — R000 Tachycardia, unspecified: Secondary | ICD-10-CM

## 2018-09-27 DIAGNOSIS — I2510 Atherosclerotic heart disease of native coronary artery without angina pectoris: Secondary | ICD-10-CM

## 2018-09-27 DIAGNOSIS — I10 Essential (primary) hypertension: Secondary | ICD-10-CM

## 2018-09-28 MED ORDER — NEBIVOLOL HCL 2.5 MG PO TABS: 2.5000 mg | ORAL_TABLET | Freq: Every day | ORAL | 0 refills | Status: DC

## 2018-10-09 ENCOUNTER — Ambulatory Visit: Payer: Federal, State, Local not specified - PPO | Admitting: Family Medicine

## 2018-10-09 DIAGNOSIS — E782 Mixed hyperlipidemia: Secondary | ICD-10-CM | POA: Diagnosis not present

## 2018-10-09 DIAGNOSIS — I10 Essential (primary) hypertension: Secondary | ICD-10-CM | POA: Diagnosis not present

## 2018-10-09 DIAGNOSIS — E119 Type 2 diabetes mellitus without complications: Secondary | ICD-10-CM | POA: Diagnosis not present

## 2018-10-09 DIAGNOSIS — R000 Tachycardia, unspecified: Secondary | ICD-10-CM

## 2018-10-09 DIAGNOSIS — D708 Other neutropenia: Secondary | ICD-10-CM | POA: Diagnosis not present

## 2018-10-09 LAB — COMPREHENSIVE METABOLIC PANEL
ALT: 27 U/L (ref 0–53)
AST: 21 U/L (ref 0–37)
Albumin: 4.4 g/dL (ref 3.5–5.2)
Alkaline Phosphatase: 54 U/L (ref 39–117)
BUN: 16 mg/dL (ref 6–23)
CHLORIDE: 103 meq/L (ref 96–112)
CO2: 24 mEq/L (ref 19–32)
Calcium: 9.6 mg/dL (ref 8.4–10.5)
Creatinine, Ser: 0.86 mg/dL (ref 0.40–1.50)
GFR: 123.31 mL/min (ref >60.00)
Glucose, Bld: 140 mg/dL — ABNORMAL HIGH (ref 70–99)
Potassium: 4 mEq/L (ref 3.5–5.1)
Sodium: 136 mEq/L (ref 135–145)
Total Bilirubin: 0.5 mg/dL (ref 0.2–1.2)
Total Protein: 7.1 g/dL (ref 6.0–8.3)

## 2018-10-09 LAB — CBC
HCT: 47.5 % (ref 39.0–52.0)
Hemoglobin: 16 g/dL (ref 13.0–17.0)
MCHC: 33.7 g/dL (ref 30.0–36.0)
MCV: 91.2 fl (ref 78.0–100.0)
Platelets: 174 10*3/uL (ref 150.0–400.0)
RBC: 5.21 Mil/uL (ref 4.22–5.81)
RDW: 12.9 % (ref 11.5–15.5)
WBC: 3.1 10*3/uL — AB (ref 4.0–10.5)

## 2018-10-09 LAB — HEMOGLOBIN A1C: Hgb A1c MFr Bld: 6.6 % — ABNORMAL HIGH (ref 4.6–6.5)

## 2018-10-09 LAB — LIPID PANEL
Cholesterol: 237 mg/dL — ABNORMAL HIGH (ref 0–200)
HDL: 47 mg/dL (ref >39.00)
LDL Cholesterol: 162 mg/dL — ABNORMAL HIGH (ref 0–99)
NonHDL: 190.06
TRIGLYCERIDES: 138 mg/dL (ref 0.0–149.0)
Total CHOL/HDL Ratio: 5
VLDL: 27.6 mg/dL (ref 0.0–40.0)

## 2018-10-09 LAB — TSH: TSH: 0.65 u[IU]/mL (ref 0.35–4.50)

## 2018-10-09 NOTE — Assessment & Plan Note (Signed)
Tolerating statin, encouraged heart healthy diet, avoid trans fats, minimize simple carbs and saturated fats. Increase exercise as tolerated 

## 2018-10-09 NOTE — Assessment & Plan Note (Signed)
Well controlled, no changes to meds. Encouraged heart healthy diet such as the DASH diet and exercise as tolerated.  °

## 2018-10-09 NOTE — Assessment & Plan Note (Signed)
Check CBC today.  
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
## 2018-10-09 NOTE — Assessment & Plan Note (Signed)
RRR today 

## 2018-10-09 NOTE — Patient Instructions (Signed)
Carbohydrate Counting for Diabetes Mellitus, Adult  Carbohydrate counting is a method of keeping track of how many carbohydrates you eat. Eating carbohydrates naturally increases the amount of sugar (glucose) in the blood. Counting how many carbohydrates you eat helps keep your blood glucose within normal limits, which helps you manage your diabetes (diabetes mellitus). It is important to know how many carbohydrates you can safely have in each meal. This is different for every person. A diet and nutrition specialist (registered dietitian) can help you make a meal plan and calculate how many carbohydrates you should have at each meal and snack. Carbohydrates are found in the following foods:  Grains, such as breads and cereals.  Dried beans and soy products.  Starchy vegetables, such as potatoes, peas, and corn.  Fruit and fruit juices.  Milk and yogurt.  Sweets and snack foods, such as cake, cookies, candy, chips, and soft drinks. How do I count carbohydrates? There are two ways to count carbohydrates in food. You can use either of the methods or a combination of both. Reading "Nutrition Facts" on packaged food The "Nutrition Facts" list is included on the labels of almost all packaged foods and beverages in the [place] It includes:  The serving size.  Information about nutrients in each serving, including the grams (g) of carbohydrate per serving. To use the "Nutrition Facts":  Decide how many servings you will have.  Multiply the number of servings by the number of carbohydrates per serving.  The resulting number is the total amount of carbohydrates that you will be having. Learning standard serving sizes of other foods When you eat carbohydrate foods that are not packaged or do not include "Nutrition Facts" on the label, you need to measure the servings in order to count the amount of carbohydrates:  Measure the foods that you will eat with a food scale or measuring cup, if needed.   Decide how many standard-size servings you will eat.  Multiply the number of servings by 15. Most carbohydrate-rich foods have about 15 g of carbohydrates per serving. ? For example, if you eat 8 oz (170 g) of strawberries, you will have eaten 2 servings and 30 g of carbohydrates (2 servings x 15 g = 30 g).  For foods that have more than one food mixed, such as soups and casseroles, you must count the carbohydrates in each food that is included. The following list contains standard serving sizes of common carbohydrate-rich foods. Each of these servings has about 15 g of carbohydrates:   hamburger bun or  English muffin.   oz (15 mL) syrup.   oz (14 g) jelly.  1 slice of bread.  1 six-inch tortilla.  3 oz (85 g) cooked rice or pasta.  4 oz (113 g) cooked dried beans.  4 oz (113 g) starchy vegetable, such as peas, corn, or potatoes.  4 oz (113 g) hot cereal.  4 oz (113 g) mashed potatoes or  of a large baked potato.  4 oz (113 g) canned or frozen fruit.  4 oz (120 mL) fruit juice.  4-6 crackers.  6 chicken nuggets.  6 oz (170 g) unsweetened dry cereal.  6 oz (170 g) plain fat-free yogurt or yogurt sweetened with artificial sweeteners.  8 oz (240 mL) milk.  8 oz (170 g) fresh fruit or one small piece of fruit.  24 oz (680 g) popped popcorn. Example of carbohydrate counting Sample meal  3 oz (85 g) chicken breast.  6 oz (170 g)   brown rice.  4 oz (113 g) corn.  8 oz (240 mL) milk.  8 oz (170 g) strawberries with sugar-free whipped topping. Carbohydrate calculation 1. Identify the foods that contain carbohydrates: ? Rice. ? Corn. ? Milk. ? Strawberries. 2. Calculate how many servings you have of each food: ? 2 servings rice. ? 1 serving corn. ? 1 serving milk. ? 1 serving strawberries. 3. Multiply each number of servings by 15 g: ? 2 servings rice x 15 g = 30 g. ? 1 serving corn x 15 g = 15 g. ? 1 serving milk x 15 g = 15 g. ? 1 serving  strawberries x 15 g = 15 g. 4. Add together all of the amounts to find the total grams of carbohydrates eaten: ? 30 g + 15 g + 15 g + 15 g = 75 g of carbohydrates total. Summary  Carbohydrate counting is a method of keeping track of how many carbohydrates you eat.  Eating carbohydrates naturally increases the amount of sugar (glucose) in the blood.  Counting how many carbohydrates you eat helps keep your blood glucose within normal limits, which helps you manage your diabetes.  A diet and nutrition specialist (registered dietitian) can help you make a meal plan and calculate how many carbohydrates you should have at each meal and snack. This information is not intended to replace advice given to you by your health care provider. Make sure you discuss any questions you have with your health care provider. Document Released: 09/26/2005 Document Revised: 04/05/2017 Document Reviewed: 03/09/2016 Elsevier Interactive Patient Education  2019 Elsevier Inc.  

## 2018-10-09 NOTE — Assessment & Plan Note (Signed)
hgba1c acceptable, minimize simple carbs. Increase exercise as tolerated. Continue current meds 

## 2018-10-11 ENCOUNTER — Other Ambulatory Visit: Payer: Self-pay | Admitting: Family Medicine

## 2018-10-11 MED ORDER — ATORVASTATIN CALCIUM 20 MG PO TABS: 20.0000 mg | ORAL_TABLET | Freq: Every day | ORAL | 2 refills | Status: DC

## 2018-12-04 ENCOUNTER — Encounter: Payer: Self-pay | Admitting: Family Medicine

## 2018-12-05 ENCOUNTER — Other Ambulatory Visit: Payer: Self-pay | Admitting: Family Medicine

## 2018-12-05 MED ORDER — OSELTAMIVIR PHOSPHATE 75 MG PO CAPS: 75.0000 mg | ORAL_CAPSULE | Freq: Every day | ORAL | 0 refills | Status: DC

## 2018-12-24 ENCOUNTER — Other Ambulatory Visit: Payer: Self-pay | Admitting: Family Medicine

## 2018-12-24 DIAGNOSIS — E7849 Other hyperlipidemia: Secondary | ICD-10-CM

## 2018-12-24 DIAGNOSIS — I2510 Atherosclerotic heart disease of native coronary artery without angina pectoris: Secondary | ICD-10-CM

## 2018-12-24 DIAGNOSIS — I10 Essential (primary) hypertension: Secondary | ICD-10-CM

## 2018-12-24 DIAGNOSIS — R000 Tachycardia, unspecified: Secondary | ICD-10-CM

## 2019-01-12 DIAGNOSIS — I10 Essential (primary) hypertension: Secondary | ICD-10-CM | POA: Diagnosis not present

## 2019-01-12 DIAGNOSIS — Z7984 Long term (current) use of oral hypoglycemic drugs: Secondary | ICD-10-CM | POA: Diagnosis not present

## 2019-01-12 DIAGNOSIS — R1111 Vomiting without nausea: Secondary | ICD-10-CM | POA: Diagnosis not present

## 2019-01-12 DIAGNOSIS — Z91030 Bee allergy status: Secondary | ICD-10-CM | POA: Diagnosis not present

## 2019-01-12 DIAGNOSIS — E119 Type 2 diabetes mellitus without complications: Secondary | ICD-10-CM | POA: Diagnosis not present

## 2019-01-12 DIAGNOSIS — R1110 Vomiting, unspecified: Secondary | ICD-10-CM | POA: Diagnosis not present

## 2019-01-12 DIAGNOSIS — E7800 Pure hypercholesterolemia, unspecified: Secondary | ICD-10-CM | POA: Diagnosis not present

## 2019-01-12 DIAGNOSIS — Z79899 Other long term (current) drug therapy: Secondary | ICD-10-CM | POA: Diagnosis not present

## 2019-01-12 DIAGNOSIS — R000 Tachycardia, unspecified: Secondary | ICD-10-CM | POA: Diagnosis not present

## 2019-04-06 ENCOUNTER — Encounter: Payer: Self-pay | Admitting: Family Medicine

## 2019-04-06 ENCOUNTER — Other Ambulatory Visit: Payer: Self-pay | Admitting: Family Medicine

## 2019-04-08 MED ORDER — LISINOPRIL 20 MG PO TABS: 20.0000 mg | ORAL_TABLET | Freq: Every day | ORAL | 1 refills | Status: DC

## 2019-04-19 ENCOUNTER — Encounter: Payer: Self-pay | Admitting: Family Medicine

## 2019-06-30 ENCOUNTER — Other Ambulatory Visit: Payer: Self-pay | Admitting: Family Medicine

## 2019-07-08 ENCOUNTER — Encounter: Payer: Self-pay | Admitting: Family Medicine

## 2019-07-08 DIAGNOSIS — I10 Essential (primary) hypertension: Secondary | ICD-10-CM

## 2019-07-08 DIAGNOSIS — R000 Tachycardia, unspecified: Secondary | ICD-10-CM

## 2019-07-08 DIAGNOSIS — E7849 Other hyperlipidemia: Secondary | ICD-10-CM

## 2019-07-08 DIAGNOSIS — I2510 Atherosclerotic heart disease of native coronary artery without angina pectoris: Secondary | ICD-10-CM

## 2019-07-08 MED ORDER — NEBIVOLOL HCL 2.5 MG PO TABS: 2.5000 mg | ORAL_TABLET | Freq: Every day | ORAL | 0 refills | Status: DC

## 2019-09-03 ENCOUNTER — Encounter: Payer: Self-pay | Admitting: Family Medicine

## 2019-09-03 MED ORDER — METFORMIN HCL ER 500 MG PO TB24: 500.0000 mg | ORAL_TABLET | Freq: Two times a day (BID) | ORAL | 0 refills | Status: DC

## 2019-09-12 ENCOUNTER — Other Ambulatory Visit: Payer: Self-pay

## 2019-09-12 ENCOUNTER — Encounter: Payer: Self-pay | Admitting: Emergency Medicine

## 2019-09-12 ENCOUNTER — Emergency Department
Admission: EM | Admit: 2019-09-12 | Discharge: 2019-09-12 | Disposition: A | Discharge status: Home / Self Care | Payer: Federal, State, Local not specified - PPO | Source: Home / Self Care

## 2019-09-12 DIAGNOSIS — M545 Low back pain, unspecified: Secondary | ICD-10-CM

## 2019-09-12 MED ORDER — METHOCARBAMOL 500 MG PO TABS: 500.0000 mg | ORAL_TABLET | Freq: Four times a day (QID) | ORAL | 0 refills | Status: DC

## 2019-09-12 MED ORDER — PREDNISONE 10 MG PO TABS: ORAL_TABLET | ORAL | 0 refills | Status: DC

## 2019-09-12 MED ORDER — HYDROCODONE-ACETAMINOPHEN 5-325 MG PO TABS: 1.0000 | ORAL_TABLET | ORAL | 0 refills | Status: DC | PRN

## 2019-09-12 NOTE — ED Triage Notes (Signed)
Pt states about 1 week ago he was moving furniture and felt some back pain. Few days ago was  working in the yard he felt severe pain. Using advil and no relief.
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
## 2019-09-25 ENCOUNTER — Other Ambulatory Visit: Payer: Self-pay | Admitting: Family Medicine

## 2019-09-26 NOTE — Telephone Encounter (Signed)
Last OV 10/09/18 Last refill 09/03/19 #60/0 Next OV 01/02/20

## 2019-10-03 ENCOUNTER — Other Ambulatory Visit: Payer: Self-pay | Admitting: Family Medicine

## 2019-10-14 DIAGNOSIS — R109 Unspecified abdominal pain: Secondary | ICD-10-CM | POA: Diagnosis not present

## 2019-10-14 DIAGNOSIS — Z79899 Other long term (current) drug therapy: Secondary | ICD-10-CM | POA: Diagnosis not present

## 2019-10-14 DIAGNOSIS — M549 Dorsalgia, unspecified: Secondary | ICD-10-CM | POA: Diagnosis not present

## 2019-10-14 DIAGNOSIS — Z91030 Bee allergy status: Secondary | ICD-10-CM | POA: Diagnosis not present

## 2019-10-14 DIAGNOSIS — M62830 Muscle spasm of back: Secondary | ICD-10-CM | POA: Diagnosis not present

## 2019-10-14 DIAGNOSIS — R52 Pain, unspecified: Secondary | ICD-10-CM | POA: Diagnosis not present

## 2019-10-14 DIAGNOSIS — M5489 Other dorsalgia: Secondary | ICD-10-CM | POA: Diagnosis not present

## 2019-10-14 DIAGNOSIS — S39012A Strain of muscle, fascia and tendon of lower back, initial encounter: Secondary | ICD-10-CM | POA: Diagnosis not present

## 2019-10-14 DIAGNOSIS — E7800 Pure hypercholesterolemia, unspecified: Secondary | ICD-10-CM | POA: Diagnosis not present

## 2019-10-14 DIAGNOSIS — Z7984 Long term (current) use of oral hypoglycemic drugs: Secondary | ICD-10-CM | POA: Diagnosis not present

## 2019-10-14 DIAGNOSIS — J168 Pneumonia due to other specified infectious organisms: Secondary | ICD-10-CM | POA: Diagnosis not present

## 2019-10-14 DIAGNOSIS — R000 Tachycardia, unspecified: Secondary | ICD-10-CM | POA: Diagnosis not present

## 2019-10-14 DIAGNOSIS — Z20822 Contact with and (suspected) exposure to covid-19: Secondary | ICD-10-CM | POA: Diagnosis not present

## 2019-10-14 DIAGNOSIS — I10 Essential (primary) hypertension: Secondary | ICD-10-CM | POA: Diagnosis not present

## 2019-10-14 DIAGNOSIS — X58XXXA Exposure to other specified factors, initial encounter: Secondary | ICD-10-CM | POA: Diagnosis not present

## 2019-10-14 DIAGNOSIS — J189 Pneumonia, unspecified organism: Secondary | ICD-10-CM | POA: Diagnosis not present

## 2019-10-14 DIAGNOSIS — R030 Elevated blood-pressure reading, without diagnosis of hypertension: Secondary | ICD-10-CM | POA: Diagnosis not present

## 2019-10-14 DIAGNOSIS — M545 Low back pain: Secondary | ICD-10-CM | POA: Diagnosis not present

## 2019-10-14 DIAGNOSIS — E119 Type 2 diabetes mellitus without complications: Secondary | ICD-10-CM | POA: Diagnosis not present

## 2019-10-14 MED ORDER — SODIUM CHLORIDE 0.9 % IV SOLN
10.00 | INTRAVENOUS | Status: DC
Start: ? — End: 2019-10-14

## 2019-10-14 MED ORDER — GENERIC EXTERNAL MEDICATION
Status: DC
Start: ? — End: 2019-10-14
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
## 2019-10-15 ENCOUNTER — Ambulatory Visit: Payer: Federal, State, Local not specified - PPO | Admitting: Family Medicine

## 2019-10-27 ENCOUNTER — Encounter: Payer: Self-pay | Admitting: Family Medicine

## 2019-10-31 ENCOUNTER — Other Ambulatory Visit: Payer: Self-pay | Admitting: Family Medicine

## 2019-10-31 ENCOUNTER — Other Ambulatory Visit: Payer: Self-pay

## 2019-10-31 ENCOUNTER — Ambulatory Visit: Payer: Federal, State, Local not specified - PPO | Admitting: Family Medicine

## 2019-10-31 ENCOUNTER — Ambulatory Visit (INDEPENDENT_AMBULATORY_CARE_PROVIDER_SITE_OTHER): Payer: Federal, State, Local not specified - PPO

## 2019-10-31 ENCOUNTER — Encounter: Payer: Self-pay | Admitting: Family Medicine

## 2019-10-31 VITALS — BP 120/94 | HR 85 | Ht 67.5 in

## 2019-10-31 DIAGNOSIS — M7552 Bursitis of left shoulder: Secondary | ICD-10-CM

## 2019-10-31 DIAGNOSIS — M545 Low back pain, unspecified: Secondary | ICD-10-CM | POA: Insufficient documentation

## 2019-10-31 DIAGNOSIS — M25512 Pain in left shoulder: Secondary | ICD-10-CM

## 2019-10-31 DIAGNOSIS — M999 Biomechanical lesion, unspecified: Secondary | ICD-10-CM | POA: Insufficient documentation

## 2019-10-31 DIAGNOSIS — G8929 Other chronic pain: Secondary | ICD-10-CM

## 2019-10-31 MED ORDER — PREDNISONE 50 MG PO TABS: ORAL_TABLET | ORAL | 0 refills | Status: DC

## 2019-10-31 MED ORDER — VITAMIN D (ERGOCALCIFEROL) 1.25 MG (50000 UNIT) PO CAPS: 50000.0000 [IU] | ORAL_CAPSULE | ORAL | 0 refills | Status: DC

## 2019-10-31 NOTE — Assessment & Plan Note (Signed)
More thoracolumbar junction.  Attempted osteopathic manipulation with some good resolution of some pain.  Prednisone given secondary to also recent inflammatory lung condition and appeared with more of a pneumonia as well.  We discussed icing regimen, we discussed ergonomics, patient responds well to manipulation and will follow-up again in 4 to 6 weeks
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
## 2019-10-31 NOTE — Assessment & Plan Note (Signed)
Very mild overall.  Do not feel that any type of injection is necessary.  Discussed icing regimen, home exercises, which activities of doing which wants to avoid.  Patient is going to make some ergonomic changes that I think will be beneficial and then follow-up again in 4 to 8 weeks

## 2019-10-31 NOTE — Patient Instructions (Addendum)
Good to see you.  Ice 20 minutes 2 times daily. Usually after activity and before bed. Exercises 3 times a week.  Prednisone for 5 days Once weekly vit d Zinc 50mg  daily Baby aspirin after prednisone See me again in 5-6 weeks

## 2019-10-31 NOTE — Assessment & Plan Note (Signed)
Decision today to treat with OMT was based on Physical Exam  After verbal consent patient was treated with HVLA, ME, FPR techniques in cervical, thoracic, rib,  lumbar and sacral areas  Patient tolerated the procedure well with improvement in symptoms  Patient given exercises, stretches and lifestyle modifications  See medications in patient instructions if given  Patient will follow up in 4-8 weeks 
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
## 2019-11-18 ENCOUNTER — Ambulatory Visit: Payer: Federal, State, Local not specified - PPO | Admitting: Cardiology

## 2019-11-18 ENCOUNTER — Encounter: Payer: Self-pay | Admitting: Cardiology

## 2019-11-18 ENCOUNTER — Other Ambulatory Visit: Payer: Self-pay

## 2019-11-18 VITALS — BP 128/74 | HR 102 | Ht 69.0 in | Wt 194.0 lb

## 2019-11-18 DIAGNOSIS — I10 Essential (primary) hypertension: Secondary | ICD-10-CM | POA: Diagnosis not present

## 2019-11-18 DIAGNOSIS — E785 Hyperlipidemia, unspecified: Secondary | ICD-10-CM | POA: Diagnosis not present

## 2019-11-18 DIAGNOSIS — R7989 Other specified abnormal findings of blood chemistry: Secondary | ICD-10-CM | POA: Diagnosis not present

## 2019-11-18 DIAGNOSIS — I5033 Acute on chronic diastolic (congestive) heart failure: Secondary | ICD-10-CM | POA: Diagnosis not present

## 2019-11-18 DIAGNOSIS — R000 Tachycardia, unspecified: Secondary | ICD-10-CM

## 2019-11-18 LAB — LIPID PANEL
Chol/HDL Ratio: 3.9 ratio (ref 0.0–5.0)
Cholesterol, Total: 178 mg/dL (ref 100–199)
HDL: 46 mg/dL (ref >39)
LDL Chol Calc (NIH): 109 mg/dL — ABNORMAL HIGH (ref 0–99)
Triglycerides: 127 mg/dL (ref 0–149)
VLDL Cholesterol Cal: 23 mg/dL (ref 5–40)

## 2019-11-18 LAB — TSH: TSH: 0.758 u[IU]/mL (ref 0.450–4.500)

## 2019-11-18 NOTE — Patient Instructions (Signed)
Medication Instructions:   Your physician recommends that you continue on your current medications as directed. Please refer to the Current Medication list given to you today.  *If you need a refill on your cardiac medications before your next appointment, please call your pharmacy*   Lab Work:  TODAY-LIPIDS AND TSH LEVEL  If you have labs (blood work) drawn today and your tests are completely normal, you will receive your results only by: Marland Kitchen MyChart Message (if you have MyChart) OR . A paper copy in the mail If you have any lab test that is abnormal or we need to change your treatment, we will call you to review the results.    Follow-Up: At [facility], you and your health needs are our priority.  As part of our continuing mission to provide you with exceptional heart care, we have created designated Provider Care Teams.  These Care Teams include your primary Cardiologist (physician) and Advanced Practice Providers (APPs -  Physician Assistants and Nurse Practitioners) who all work together to provide you with the care you need, when you need it.  Your next appointment:   12 month(s)  The format for your next appointment:   In Person  Provider:   Tobias Alexander, MD

## 2019-11-20 ENCOUNTER — Telehealth: Payer: Self-pay | Admitting: *Deleted

## 2019-11-20 MED ORDER — ATORVASTATIN CALCIUM 40 MG PO TABS: 40.0000 mg | ORAL_TABLET | Freq: Every day | ORAL | 3 refills | Status: DC

## 2019-11-20 NOTE — Telephone Encounter (Signed)
Spoke with the pt and informed him that per Dr. Delton See, his labs showed elevated LDL, and she recommends that we increase his atorvastatin to 40 mg po daily at bedtime. Pt states he will use his current supply of atorvastatin 20 mg he has on-hand and double up on this.  Pt requested that I call this new dose increase to his confirmed pharmacy of choice, and place a note to pharmacy endorsing that he will call for his refill of this med.  Pt verbalized understanding and agrees with this plan. Note placed to pharmacy to refill later, and pt will call for this.

## 2019-11-20 NOTE — Telephone Encounter (Signed)
-----   Message from Lars Masson, MD sent at 11/18/2019  7:26 PM EST ----- Elevated LDL, I would increase atorvastatin 40 mg po QHS

## 2019-12-05 ENCOUNTER — Ambulatory Visit (INDEPENDENT_AMBULATORY_CARE_PROVIDER_SITE_OTHER): Payer: Federal, State, Local not specified - PPO

## 2019-12-05 ENCOUNTER — Encounter: Payer: Self-pay | Admitting: Family Medicine

## 2019-12-05 ENCOUNTER — Ambulatory Visit: Payer: Federal, State, Local not specified - PPO | Admitting: Family Medicine

## 2019-12-05 ENCOUNTER — Other Ambulatory Visit: Payer: Self-pay

## 2019-12-05 VITALS — BP 100/84 | HR 63 | Ht 69.0 in | Wt 197.0 lb

## 2019-12-05 DIAGNOSIS — G8929 Other chronic pain: Secondary | ICD-10-CM

## 2019-12-05 DIAGNOSIS — M7552 Bursitis of left shoulder: Secondary | ICD-10-CM | POA: Diagnosis not present

## 2019-12-05 DIAGNOSIS — M25512 Pain in left shoulder: Secondary | ICD-10-CM | POA: Diagnosis not present

## 2019-12-05 NOTE — Patient Instructions (Addendum)
Good to see you Injected the shoulder today  Start exercises again on Monday  Try to keep hands within peripheral vision  See me again in 6 weeks if not perfect

## 2019-12-05 NOTE — Assessment & Plan Note (Signed)
Injection given today, tolerated the procedure well, discussed icing regimen and home exercise, which activities to do which wants to avoid.  Patient is to increase activity slowly over the course the next several weeks.  Follow-up again in 4 to 8 weeks this is a chronic problem with mild exacerbation or no significant improvement.  Differential includes cervical radiculopathy.
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
## 2019-12-07 ENCOUNTER — Other Ambulatory Visit: Payer: Self-pay | Admitting: Family Medicine

## 2020-01-01 ENCOUNTER — Other Ambulatory Visit: Payer: Self-pay

## 2020-01-02 ENCOUNTER — Encounter: Payer: Self-pay | Admitting: Family Medicine

## 2020-01-02 ENCOUNTER — Ambulatory Visit (INDEPENDENT_AMBULATORY_CARE_PROVIDER_SITE_OTHER): Payer: Federal, State, Local not specified - PPO | Admitting: Family Medicine

## 2020-01-02 VITALS — BP 133/80 | HR 91 | Temp 97.9°F | Resp 16 | Ht 69.0 in | Wt 195.0 lb

## 2020-01-02 DIAGNOSIS — Z125 Encounter for screening for malignant neoplasm of prostate: Secondary | ICD-10-CM | POA: Diagnosis not present

## 2020-01-02 DIAGNOSIS — E119 Type 2 diabetes mellitus without complications: Secondary | ICD-10-CM

## 2020-01-02 DIAGNOSIS — J189 Pneumonia, unspecified organism: Secondary | ICD-10-CM

## 2020-01-02 DIAGNOSIS — Z0000 Encounter for general adult medical examination without abnormal findings: Secondary | ICD-10-CM

## 2020-01-02 DIAGNOSIS — I10 Essential (primary) hypertension: Secondary | ICD-10-CM | POA: Diagnosis not present

## 2020-01-02 DIAGNOSIS — E782 Mixed hyperlipidemia: Secondary | ICD-10-CM

## 2020-01-02 LAB — CBC WITH DIFFERENTIAL/PLATELET
Basophils Absolute: 0 10*3/uL (ref 0.0–0.1)
Basophils Relative: 0.3 % (ref 0.0–3.0)
Eosinophils Absolute: 0.1 10*3/uL (ref 0.0–0.7)
Eosinophils Relative: 1.8 % (ref 0.0–5.0)
HCT: 45.5 % (ref 39.0–52.0)
Hemoglobin: 15.2 g/dL (ref 13.0–17.0)
Lymphocytes Relative: 27.2 % (ref 12.0–46.0)
Lymphs Abs: 1.2 10*3/uL (ref 0.7–4.0)
MCHC: 33.4 g/dL (ref 30.0–36.0)
MCV: 90.7 fl (ref 78.0–100.0)
Monocytes Absolute: 0.5 10*3/uL (ref 0.1–1.0)
Monocytes Relative: 10.1 % (ref 3.0–12.0)
Neutro Abs: 2.8 10*3/uL (ref 1.4–7.7)
Neutrophils Relative %: 60.6 % (ref 43.0–77.0)
Platelets: 190 10*3/uL (ref 150.0–400.0)
RBC: 5.01 Mil/uL (ref 4.22–5.81)
RDW: 13.7 % (ref 11.5–15.5)
WBC: 4.5 10*3/uL (ref 4.0–10.5)

## 2020-01-02 LAB — HEMOGLOBIN A1C: Hgb A1c MFr Bld: 6.9 % — ABNORMAL HIGH (ref 4.6–6.5)

## 2020-01-02 LAB — COMPREHENSIVE METABOLIC PANEL
ALT: 21 U/L (ref 0–53)
AST: 16 U/L (ref 0–37)
Albumin: 4.4 g/dL (ref 3.5–5.2)
Alkaline Phosphatase: 66 U/L (ref 39–117)
BUN: 12 mg/dL (ref 6–23)
CO2: 28 mEq/L (ref 19–32)
Calcium: 9.5 mg/dL (ref 8.4–10.5)
Chloride: 102 mEq/L (ref 96–112)
Creatinine, Ser: 0.84 mg/dL (ref 0.40–1.50)
GFR: 97.99 mL/min (ref >60.00)
Glucose, Bld: 121 mg/dL — ABNORMAL HIGH (ref 70–99)
Potassium: 4.1 mEq/L (ref 3.5–5.1)
Sodium: 136 mEq/L (ref 135–145)
Total Bilirubin: 0.6 mg/dL (ref 0.2–1.2)
Total Protein: 7.3 g/dL (ref 6.0–8.3)

## 2020-01-02 LAB — PSA: PSA: 1.4 ng/mL (ref 0.10–4.00)

## 2020-01-02 NOTE — Assessment & Plan Note (Signed)
Patient encouraged to maintain heart healthy diet, regular exercise, adequate sleep. Consider daily probiotics. Take medications as prescribed. Lab results reviewed and ordered

## 2020-01-02 NOTE — Assessment & Plan Note (Signed)
Well controlled, no changes to meds. Encouraged heart healthy diet such as the DASH diet and exercise as tolerated.  °

## 2020-01-02 NOTE — Assessment & Plan Note (Signed)
Encouraged heart healthy diet, increase exercise, avoid trans fats, consider a krill oil cap daily 

## 2020-01-02 NOTE — Patient Instructions (Addendum)
[facility] Blood Pressure cuff, upper arm, want BP 100-140/60-90 Pulse oximeter, want oxygen in 90s  Weekly vitals  Take Multivitamin with minerals, selenium Vitamin D 1000-2000 IU daily Probiotic with lactobacillus and bifidophilus Asprin EC 81 mg daily Fish or krill oil cap daily Melatonin 2-5 mg at bedtime  [email or web address]    Preventive Care 47-47 Years Old, Male Preventive care refers to lifestyle choices and visits with your health care provider that can promote health and wellness. This includes:  A yearly physical exam. This is also called an annual well check.  Regular dental and eye exams.  Immunizations.  Screening for certain conditions.  Healthy lifestyle choices, such as eating a healthy diet, getting regular exercise, not using drugs or products that contain nicotine and tobacco, and limiting alcohol use. What can I expect for my preventive care visit? Physical exam Your health care provider will check:  Height and weight. These may be used to calculate body mass index (BMI), which is a measurement that tells if you are at a healthy weight.  Heart rate and blood pressure.  Your skin for abnormal spots. Counseling Your health care provider may ask you questions about:  Alcohol, tobacco, and drug use.  Emotional well-being.  Home and relationship well-being.  Sexual activity.  Eating habits.  Work and work Statistician. What immunizations do I need?  Influenza (flu) vaccine  This is recommended every year. Tetanus, diphtheria, and pertussis (Tdap) vaccine  You may need a Td booster every 10 years. Varicella (chickenpox) vaccine  You may need this vaccine if you have not already been vaccinated. Zoster (shingles) vaccine  You may need this after age 47. Measles, mumps, and rubella (MMR) vaccine  You may need at least one dose of MMR if you were born in 1957 or later. You may also need a second  dose. Pneumococcal conjugate (PCV13) vaccine  You may need this if you have certain conditions and were not previously vaccinated. Pneumococcal polysaccharide (PPSV23) vaccine  You may need one or two doses if you smoke cigarettes or if you have certain conditions. Meningococcal conjugate (MenACWY) vaccine  You may need this if you have certain conditions. Hepatitis A vaccine  You may need this if you have certain conditions or if you travel or work in places where you may be exposed to hepatitis A. Hepatitis B vaccine  You may need this if you have certain conditions or if you travel or work in places where you may be exposed to hepatitis B. Haemophilus influenzae type b (Hib) vaccine  You may need this if you have certain risk factors. Human papillomavirus (HPV) vaccine  If recommended by your health care provider, you may need three doses over 6 months. You may receive vaccines as individual doses or as more than one vaccine together in one shot (combination vaccines). Talk with your health care provider about the risks and benefits of combination vaccines. What tests do I need? Blood tests  Lipid and cholesterol levels. These may be checked every 5 years, or more frequently if you are over 17 years old.  Hepatitis C test.  Hepatitis B test. Screening  Lung cancer screening. You may have this screening every year starting at age 93 if you have a 30-pack-year history of smoking and currently smoke or have quit within the past 15 years.  Prostate cancer screening. Recommendations will vary depending on your family history and other risks.  Colorectal cancer screening. All adults should have this screening starting at age  47 and continuing until age 47. Your health care provider may recommend screening at age 74 if you are at increased risk. You will have tests every 1-10 years, depending on your results and the type of screening test.  Diabetes screening. This is done by  checking your blood sugar (glucose) after you have not eaten for a while (fasting). You may have this done every 1-3 years.  Sexually transmitted disease (STD) testing. Follow these instructions at home: Eating and drinking  Eat a diet that includes fresh fruits and vegetables, whole grains, lean protein, and low-fat dairy products.  Take vitamin and mineral supplements as recommended by your health care provider.  Do not drink alcohol if your health care provider tells you not to drink.  If you drink alcohol: ? Limit how much you have to 0-2 drinks a day. ? Be aware of how much alcohol is in your drink. In the [place] one drink equals one 12 oz bottle of beer (355 mL), one 5 oz glass of wine (148 mL), or one 1 oz glass of hard liquor (44 mL). Lifestyle  Take daily care of your teeth and gums.  Stay active. Exercise for at least 30 minutes on 5 or more days each week.  Do not use any products that contain nicotine or tobacco, such as cigarettes, e-cigarettes, and chewing tobacco. If you need help quitting, ask your health care provider.  If you are sexually active, practice safe sex. Use a condom or other form of protection to prevent STIs (sexually transmitted infections).  Talk with your health care provider about taking a low-dose aspirin every day starting at age 47. What's next?  Go to your health care provider once a year for a well check visit.  Ask your health care provider how often you should have your eyes and teeth checked.  Stay up to date on all vaccines. This information is not intended to replace advice given to you by your health care provider. Make sure you discuss any questions you have with your health care provider. Document Revised: 09/20/2018 Document Reviewed: 09/20/2018 Elsevier Patient Education  2020 [employer].
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
## 2020-01-02 NOTE — Assessment & Plan Note (Signed)
hgba1c acceptable, minimize simple carbs. Increase exercise as tolerated. Continue current meds 

## 2020-01-03 ENCOUNTER — Other Ambulatory Visit: Payer: Self-pay | Admitting: Family Medicine

## 2020-01-03 DIAGNOSIS — R000 Tachycardia, unspecified: Secondary | ICD-10-CM

## 2020-01-03 DIAGNOSIS — I10 Essential (primary) hypertension: Secondary | ICD-10-CM

## 2020-01-03 DIAGNOSIS — E7849 Other hyperlipidemia: Secondary | ICD-10-CM

## 2020-01-03 DIAGNOSIS — I2510 Atherosclerotic heart disease of native coronary artery without angina pectoris: Secondary | ICD-10-CM

## 2020-01-04 ENCOUNTER — Other Ambulatory Visit: Payer: Self-pay | Admitting: Family Medicine

## 2020-01-05 DIAGNOSIS — J189 Pneumonia, unspecified organism: Secondary | ICD-10-CM | POA: Insufficient documentation

## 2020-01-05 NOTE — Assessment & Plan Note (Signed)
Greatly improved but needs a f/u CT scan next month this is ordered today

## 2020-01-15 ENCOUNTER — Other Ambulatory Visit: Payer: Self-pay | Admitting: Family Medicine

## 2020-01-16 ENCOUNTER — Other Ambulatory Visit: Payer: Self-pay

## 2020-01-16 ENCOUNTER — Ambulatory Visit (HOSPITAL_BASED_OUTPATIENT_CLINIC_OR_DEPARTMENT_OTHER)
Admission: RE | Admit: 2020-01-16 | Discharge: 2020-01-16 | Disposition: A | Discharge status: Home / Self Care | Payer: Federal, State, Local not specified - PPO | Source: Ambulatory Visit | Attending: Family Medicine | Admitting: Family Medicine

## 2020-01-16 DIAGNOSIS — J181 Lobar pneumonia, unspecified organism: Secondary | ICD-10-CM | POA: Diagnosis not present

## 2020-01-16 DIAGNOSIS — J189 Pneumonia, unspecified organism: Secondary | ICD-10-CM | POA: Insufficient documentation

## 2020-01-17 ENCOUNTER — Encounter: Payer: Self-pay | Admitting: Family Medicine

## 2020-01-21 ENCOUNTER — Ambulatory Visit: Payer: Federal, State, Local not specified - PPO | Admitting: Family Medicine

## 2020-02-02 ENCOUNTER — Other Ambulatory Visit: Payer: Self-pay | Admitting: Family Medicine

## 2020-04-06 ENCOUNTER — Other Ambulatory Visit: Payer: Self-pay | Admitting: Family Medicine

## 2020-04-25 ENCOUNTER — Encounter: Payer: Self-pay | Admitting: Family Medicine

## 2020-04-27 MED ORDER — METFORMIN HCL ER 500 MG PO TB24: 500.0000 mg | ORAL_TABLET | Freq: Two times a day (BID) | ORAL | 1 refills | Status: DC

## 2020-05-26 ENCOUNTER — Encounter: Payer: Self-pay | Admitting: Family Medicine

## 2020-05-26 ENCOUNTER — Other Ambulatory Visit: Payer: Self-pay | Admitting: Family Medicine

## 2020-05-26 MED ORDER — AMOXICILLIN 500 MG PO CAPS: 500.0000 mg | ORAL_CAPSULE | Freq: Three times a day (TID) | ORAL | 0 refills | Status: DC

## 2020-06-09 DIAGNOSIS — Z23 Encounter for immunization: Secondary | ICD-10-CM | POA: Diagnosis not present

## 2020-07-07 ENCOUNTER — Ambulatory Visit: Payer: Federal, State, Local not specified - PPO | Admitting: Family Medicine

## 2020-07-13 ENCOUNTER — Other Ambulatory Visit: Payer: Self-pay | Admitting: Family Medicine

## 2020-07-13 DIAGNOSIS — R000 Tachycardia, unspecified: Secondary | ICD-10-CM

## 2020-07-13 DIAGNOSIS — I10 Essential (primary) hypertension: Secondary | ICD-10-CM

## 2020-07-13 DIAGNOSIS — E7849 Other hyperlipidemia: Secondary | ICD-10-CM

## 2020-07-13 DIAGNOSIS — I2510 Atherosclerotic heart disease of native coronary artery without angina pectoris: Secondary | ICD-10-CM

## 2020-08-27 ENCOUNTER — Ambulatory Visit: Payer: Federal, State, Local not specified - PPO | Admitting: Family Medicine

## 2020-08-28 ENCOUNTER — Encounter: Payer: Self-pay | Admitting: Family Medicine

## 2020-08-28 ENCOUNTER — Telehealth (INDEPENDENT_AMBULATORY_CARE_PROVIDER_SITE_OTHER): Payer: Federal, State, Local not specified - PPO | Admitting: Family Medicine

## 2020-08-28 ENCOUNTER — Other Ambulatory Visit: Payer: Self-pay

## 2020-08-28 DIAGNOSIS — E559 Vitamin D deficiency, unspecified: Secondary | ICD-10-CM | POA: Diagnosis not present

## 2020-08-28 DIAGNOSIS — E782 Mixed hyperlipidemia: Secondary | ICD-10-CM

## 2020-08-28 DIAGNOSIS — I10 Essential (primary) hypertension: Secondary | ICD-10-CM

## 2020-08-28 DIAGNOSIS — E119 Type 2 diabetes mellitus without complications: Secondary | ICD-10-CM

## 2020-08-28 NOTE — Telephone Encounter (Signed)
Chart updated

## 2020-08-30 DIAGNOSIS — E559 Vitamin D deficiency, unspecified: Secondary | ICD-10-CM | POA: Insufficient documentation

## 2020-08-30 NOTE — Assessment & Plan Note (Signed)
Supplement and monitor 

## 2020-08-30 NOTE — Assessment & Plan Note (Signed)
Monitor and report any concerns, no changes to meds. Encouraged heart healthy diet such as the DASH diet and exercise as tolerated.  ?
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
## 2020-08-30 NOTE — Assessment & Plan Note (Signed)
Encouraged heart healthy diet, increase exercise, avoid trans fats, consider a krill oil cap daily 

## 2020-08-30 NOTE — Assessment & Plan Note (Signed)
hgba1c acceptable, minimize simple carbs. Increase exercise as tolerated. Continue current meds 

## 2020-10-23 ENCOUNTER — Other Ambulatory Visit: Payer: Self-pay | Admitting: Family Medicine

## 2020-12-01 ENCOUNTER — Encounter: Payer: Self-pay | Admitting: Family Medicine

## 2020-12-02 MED ORDER — ATORVASTATIN CALCIUM 40 MG PO TABS: 40.0000 mg | ORAL_TABLET | Freq: Every day | ORAL | 3 refills | Status: DC

## 2021-01-19 ENCOUNTER — Other Ambulatory Visit: Payer: Self-pay | Admitting: Family Medicine

## 2021-01-19 DIAGNOSIS — I10 Essential (primary) hypertension: Secondary | ICD-10-CM

## 2021-01-19 DIAGNOSIS — I2510 Atherosclerotic heart disease of native coronary artery without angina pectoris: Secondary | ICD-10-CM

## 2021-01-19 DIAGNOSIS — R000 Tachycardia, unspecified: Secondary | ICD-10-CM

## 2021-01-19 DIAGNOSIS — E7849 Other hyperlipidemia: Secondary | ICD-10-CM

## 2021-01-21 ENCOUNTER — Encounter: Payer: Self-pay | Admitting: Family Medicine

## 2021-01-21 ENCOUNTER — Other Ambulatory Visit: Payer: Self-pay | Admitting: Family Medicine

## 2021-01-26 NOTE — Telephone Encounter (Signed)
Pt has a doctor where he lives and didn't need meds refilled

## 2021-04-17 ENCOUNTER — Other Ambulatory Visit: Payer: Self-pay | Admitting: Family Medicine

## 2021-04-20 ENCOUNTER — Other Ambulatory Visit: Payer: Self-pay | Admitting: Family Medicine

## 2021-12-04 ENCOUNTER — Other Ambulatory Visit: Payer: Self-pay | Admitting: Family Medicine
